# Patient Record
Sex: Female | Born: 1990 | Race: White | Hispanic: No | Marital: Single | Smoking: Current every day smoker
Health system: Southern US, Community
[De-identification: ages and names within clinical notes are randomized; demographics above are authoritative.]

## PROBLEM LIST (undated history)

## (undated) DIAGNOSIS — F32A Depression, unspecified: Secondary | ICD-10-CM

## (undated) DIAGNOSIS — F329 Major depressive disorder, single episode, unspecified: Secondary | ICD-10-CM

## (undated) DIAGNOSIS — N200 Calculus of kidney: Secondary | ICD-10-CM

## (undated) DIAGNOSIS — F419 Anxiety disorder, unspecified: Secondary | ICD-10-CM

## (undated) DIAGNOSIS — K219 Gastro-esophageal reflux disease without esophagitis: Secondary | ICD-10-CM

## (undated) DIAGNOSIS — Z3490 Encounter for supervision of normal pregnancy, unspecified, unspecified trimester: Secondary | ICD-10-CM

## (undated) HISTORY — PX: CHOLECYSTECTOMY: SHX55

## (undated) HISTORY — DX: Anxiety disorder, unspecified: F41.9

## (undated) HISTORY — PX: OTHER SURGICAL HISTORY: SHX169

## (undated) HISTORY — DX: Major depressive disorder, single episode, unspecified: F32.9

## (undated) HISTORY — PX: EXTERNAL EAR SURGERY: SHX627

## (undated) HISTORY — PX: TONSILLECTOMY: SUR1361

## (undated) HISTORY — DX: Depression, unspecified: F32.A

---
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
## 2010-12-06 LAB — CBC
HCT: 32.8 % — ABNORMAL LOW (ref 36.0–46.0)
MCV: 91.7 fL (ref 78.0–100.0)
Platelets: 287 10*3/uL (ref 150–400)
RDW: 13.1 % (ref 11.5–15.5)

## 2010-12-11 LAB — CBC
Platelets: 210 10*3/uL (ref 150–400)
RDW: 12.5 % (ref 11.4–15.5)

## 2010-12-11 LAB — BASIC METABOLIC PANEL
BUN: 4 mg/dL — ABNORMAL LOW (ref 6–23)
Calcium: 9 mg/dL (ref 8.4–10.5)
Glucose, Bld: 82 mg/dL (ref 70–99)
Sodium: 137 mEq/L (ref 135–145)

## 2010-12-11 LAB — HEPATIC FUNCTION PANEL
AST: 13 U/L (ref 0–37)
Albumin: 3.5 g/dL (ref 3.5–5.2)

## 2010-12-13 LAB — CBC
Hemoglobin: 12.6 g/dL (ref 12.0–16.0)
RBC: 3.92 MIL/uL (ref 3.80–5.70)
RDW: 14.8 % (ref 11.4–15.5)

## 2010-12-13 LAB — URINALYSIS, ROUTINE W REFLEX MICROSCOPIC
Ketones, ur: NEGATIVE mg/dL
Nitrite: NEGATIVE
Specific Gravity, Urine: 1.015 (ref 1.005–1.030)
pH: 6.5 (ref 5.0–8.0)

## 2010-12-13 LAB — COMPREHENSIVE METABOLIC PANEL
ALT: 12 U/L (ref 0–35)
AST: 14 U/L (ref 0–37)
Albumin: 4.2 g/dL (ref 3.5–5.2)
Alkaline Phosphatase: 65 U/L (ref 47–119)
BUN: 8 mg/dL (ref 6–23)
CO2: 28 mEq/L (ref 19–32)
Calcium: 9.4 mg/dL (ref 8.4–10.5)
Chloride: 105 mEq/L (ref 96–112)
Creatinine, Ser: 0.57 mg/dL (ref 0.4–1.2)
Glucose, Bld: 85 mg/dL (ref 70–99)
Potassium: 3.5 mEq/L (ref 3.5–5.1)
Sodium: 138 mEq/L (ref 135–145)
Total Bilirubin: 0.5 mg/dL (ref 0.3–1.2)
Total Protein: 7.6 g/dL (ref 6.0–8.3)

## 2010-12-13 LAB — DIFFERENTIAL
Basophils Relative: 1 % (ref 0–1)
Eosinophils Absolute: 0.3 10*3/uL (ref 0.0–1.2)
Eosinophils Relative: 3 % (ref 0–5)
Monocytes Absolute: 0.7 10*3/uL (ref 0.2–1.2)
Monocytes Relative: 7 % (ref 3–11)
Neutrophils Relative %: 68 % (ref 43–71)

## 2010-12-13 LAB — PREGNANCY, URINE: Preg Test, Ur: POSITIVE

## 2011-01-11 ENCOUNTER — Emergency Department (HOSPITAL_COMMUNITY): Payer: Medicaid Other

## 2011-01-11 ENCOUNTER — Emergency Department (HOSPITAL_COMMUNITY)
Admission: EM | Admit: 2011-01-11 | Discharge: 2011-01-11 | Disposition: A | Discharge status: Home / Self Care | Payer: Medicaid Other | Attending: Emergency Medicine | Admitting: Emergency Medicine

## 2011-01-11 DIAGNOSIS — M25539 Pain in unspecified wrist: Secondary | ICD-10-CM | POA: Insufficient documentation

## 2011-01-11 DIAGNOSIS — M65839 Other synovitis and tenosynovitis, unspecified forearm: Secondary | ICD-10-CM | POA: Insufficient documentation

## 2011-01-15 NOTE — Consult Note (Signed)
NAMEEZRI, FANGUY                 ACCOUNT NO.:  [id number]   MEDICAL RECORD NO.:  [id number]          PATIENT TYPE:  INP   LOCATION:  6123                         FACILITY:  [facility]   PHYSICIAN:  Zola Button T. Lazarus Salines, M.D. DATE OF BIRTH:  1991-03-12   DATE OF CONSULTATION:  12/06/2007  DATE OF DISCHARGE:                                 CONSULTATION   CHIEF COMPLAINT:  Bleeding.   HISTORY:  A 20 year old white female underwent a tonsillectomy  approximately 9 days ago by Dr. Gerilyn Pilgrim.  Four hours ago, she had sudden  onset bright red bleeding from her throat while sitting watching TV.  She has continued to bleed.  She went to the [facility]  emergency room where she continued to bleed.  An IV was started and she  was transferred down to [facility] for further attention.  She has  been vomiting some blood.  She is not a known free-bleeder.  She had her  adenoids out some years ago.  She otherwise has been really healing  quite nicely from the surgery with decent pain control, no prior  bleeding, and good advancement of diet.   PAST MEDICAL HISTORY:  No known allergies.  She has had multiple prior  surgeries on her ears.  No current active medical conditions.   SOCIAL HISTORY:  She is in high school.   FAMILY HISTORY:  Noncontributory.   REVIEW OF SYSTEMS:  Noncontributory.   EXAMINATION:  This is a chubby, distressed, adolescent white female who  is actively bleeding from the mouth.  In the 15 minutes I spent with her  she bled approximately 300 mL.  Mental status is sharp.  She seems to  hear well in conversational speech.  Voice is clear and respirations  unlabored through the nose.  Head is atraumatic and neck supple.  Cranial nerves grossly intact.  I did not perform any further  examination.  LUNGS:  Clear to auscultation.  HEART:  Revealed a slightly rapid rate but regular and without murmurs.  ABDOMEN:  Obese but active.   IMPRESSION:  Post tonsillectomy  hemorrhage.   PLAN:  To the OR for control.  I have discussed this with Dr. Gypsy Balsam,  anesthesiologist, and with the parents.  Informed consent was obtained.     Gloris Manchester. Lazarus Salines, M.D.  Electronically Signed    KTW/MEDQ  D:  12/06/2007  T:  12/06/2007  Job:  [id number]   cc:   Lucky Cowboy, MD

## 2011-01-15 NOTE — Op Note (Signed)
NAME:  Rachel Meadows, Rachel Meadows                 ACCOUNT NO.:  [id number]   MEDICAL RECORD NO.:  [id number]          PATIENT TYPE:  AMB   LOCATION:  DAY                           FACILITY:  [facility]   PHYSICIAN:  Dalia Heading, M.D.  DATE OF BIRTH:  29-Apr-1991   DATE OF PROCEDURE:  01/16/2009  DATE OF DISCHARGE:  01/16/2009                               OPERATIVE REPORT   PREOPERATIVE DIAGNOSES:  Cholecystitis, cholelithiasis, intrauterine  pregnancy.   POSTOPERATIVE DIAGNOSIS:  Cholecystitis, cholelithiasis, intrauterine  pregnancy.   PROCEDURE:  Laparoscopic cholecystectomy.   SURGEON:  Dalia Heading, MD   ANESTHESIA:  General endotracheal.   INDICATIONS:  The patient is a 20 year old white female who is in her  second trimester pregnancy, who now presents for laparoscopic  cholecystectomy due to biliary colic.  Risks and benefits of the  procedure including bleeding, infection, hepatobiliary injury, the  possibility of an open procedure, and the possibly of a miscarriage were  fully explained to the patient and mother, who gave informed consent for  the patient as the patient is a minor.   PROCEDURE NOTE:  The patient was placed in the supine position.  After  induction of general endotracheal anesthesia, the abdomen was prepped  and draped using the usual sterile technique with Betadine.  Surgical  site confirmation was performed.  A supraumbilical incision was made  down to the fascia.  A Veress needle was introduced into the abdominal  cavity and confirmation of placement was done using the saline drop  test.  The abdomen was then insufflated to 16 mmHg pressure.  An 11-mm  trocar was introduced into the abdominal cavity under direct  visualization without difficulty.  The patient was placed in reverse  Trendelenburg position, and additional 11-mm trocar was placed in the  epigastric region, and 5-mm trocars were placed in the right upper  quadrant and right flank regions.  The  uterus was inspected and noted to  be gravid in nature.  No abnormalities were noted.  The liver was  inspected and noted to be within normal limits.  The gallbladder was  retracted superior and laterally.  The dissection was begun around the  infundibulum of the gallbladder.  The cystic duct was first identified.  Its juncture to the infundibulum fully identified.  EndoClips were  placed proximally and distally on the cystic duct, and the cystic duct  was divided.  This likewise done in the cystic artery.  The gallbladder  was then freed away from the gallbladder fossa using Bovie  electrocautery.  The gallbladder was delivered through the epigastric  trocar site using EndoCatch bag.  The gallbladder fossa was inspected.  No abnormal bleeding or bile leakage was noted.  Surgicel was placed in  the gallbladder fossa.  All fluid and air were then evacuated from the  abdominal cavity prior to removal of the trocars.   All wounds were irrigated with normal saline.  All wounds were checked  with 0.5% Sensorcaine.  The supraumbilical fascia was reapproximated  using an 0 Vicryl interrupted suture.  All skin incisions were closed  using staples.  Betadine ointment and dry sterile dressings were  applied.   All tape and needle counts were correct at the end of the procedure.  The patient was extubated in the operating room, went back to recovery  room, awake in stable condition.   COMPLICATIONS:  None.   SPECIMEN:  Gallbladder.   BLOOD LOSS:  Minimal.   Of note was the fact that fetal heart tones were noted both pre and  postoperatively.      Dalia Heading, M.D.  Electronically Signed     MAJ/MEDQ  D:  01/16/2009  T:  01/17/2009  Job:  [id number]   cc:   Lazaro Arms, M.D.  Fax: [phone]

## 2011-01-15 NOTE — Op Note (Signed)
NAMESTEPHENIA, VOGAN NO.:  [id number]   MEDICAL RECORD NO.:  [id number]          PATIENT TYPE:  AMB   LOCATION:  DSC                          FACILITY:  [facility]   PHYSICIAN:  Lucky Cowboy, MD         DATE OF BIRTH:  05/24/1991   DATE OF PROCEDURE:  11/27/2007  DATE OF DISCHARGE:                               OPERATIVE REPORT   PREOPERATIVE DIAGNOSIS:  Chronic tonsillitis   POSTOPERATIVE DIAGNOSIS:   PROCEDURE:  Tonsillectomy.   SURGEON:  Lucky Cowboy, MD   ANESTHESIA:  General endotracheal anesthesia.   ESTIMATED BLOOD LOSS:  Less than 20 mL.   SPECIMENS:  Bilateral tonsils.   COMPLICATIONS:  None.   INDICATIONS:  This patient is a 20 year old female who has had problems  with chronic tonsillitis.  This is causing her significant discomfort.  She has been treated with multiple courses of antibiotic therapy.  For  these reasons, in addition to a mildly asymmetric and enlarged left  tonsil, tonsillectomy is performed.  The patient has already undergone  adenoidectomy in the past.   FINDINGS:  The patient was noted to have a left tonsil which was  enlarged to approximately 4+ and the right tonsil was 3+.  Chronic  infection and scarring were noted.  There had been no regrowth of the  adenoid tissue.   PROCEDURE:  The patient was taken to the operating room and placed on  the table in the supine position.  She was then placed under general  endotracheal anesthesia and the table was rotated counterclockwise 90  degrees.  The neck was gently extended.  A Crowe-Davis mouth gag with a  #3 tongue blade was then placed intraorally, opened and suspended on the  Mayo stand.  Palpation of the soft palate was without evidence of  abnormality or submucosal cleft and the nasopharynx was without regrowth  of adenoid tissue.  The right palatine tonsil was grasped with Allis  clamps and directed inferomedially.  The cautery was used to excise the  tonsil, staying within  the peritonsillar space adjacent to the tonsillar  capsule.  The left palatine tonsil was removed in an identical fashion.  The nasopharynx was copiously irrigated transnasally with normal saline,  which was suctioned out through the oral cavity.  An NG tube  was placed down the esophagus for suctioning of the gastric contents.  The mouth gag was removed, noting no damage to the teeth or soft  tissues.  The patient was awakened from anesthesia and taken to the Post  Anesthesia Care Unit in stable condition.  There were no complications.      Lucky Cowboy, MD  Electronically Signed     SJ/MEDQ  D:  11/27/2007  T:  11/28/2007  Job:  [id number]   cc:   [facility]

## 2011-01-15 NOTE — Op Note (Signed)
[name], Rachel Meadows                 ACCOUNT NO.:  [id number]   MEDICAL RECORD NO.:  [id number]          PATIENT TYPE:  INP   LOCATION:  6123                         FACILITY:  [facility]   PHYSICIAN:  Zola Button T. Lazarus Salines, M.D. DATE OF BIRTH:  12-Dec-1990   DATE OF PROCEDURE:  12/06/2007  DATE OF DISCHARGE:                               OPERATIVE REPORT   PREOPERATIVE DIAGNOSIS:  Post tonsillectomy hemorrhage.   POSTOPERATIVE DIAGNOSIS:  Post tonsillectomy hemorrhage.   PROCEDURE PERFORMED:  Control of post tonsillectomy hemorrhage.   SURGEON:  Gloris Manchester. Lazarus Salines, M.D.   ANESTHESIA:  General orotracheal.   BLOOD LOSS:  25 mL   COMPLICATIONS:  None.   FINDINGS:  Properly healing tonsil fossae with an arteriolar bleeding  vessel in the mid left tonsil fossa.   PROCEDURE:  With the patient in a comfortable supine position, general  orotracheal anesthesia was induced without difficulty.  At an  appropriate level, the table was turned 90 degrees and the patient  placed in Trendelenburg.  Taking care to protect lips, teeth, and  endotracheal tube, the Crowe-Davis mouth gag was introduced, expanded  for visualization, and suspended from the Mayo stand in the standard  fashion.  The findings were as described above.  A large clot was  removed from the left tonsil fossa and active bleeding was noted in the  mid pole; this was controlled with suction cautery.  Hemostasis was  achieved.  An 18-French orogastric tube was placed into the stomach and  approximately 50 mL of old brown/black blood and some food particles  were evacuated from the stomach.  The tube was removed.  Hemostasis was  again observed.  At this point, the mouth gag was relaxed for several  minutes.  Upon re-expansion, hemostasis was persistent.  At this point,  the procedure was complete and mouth gag was relaxed and removed.  The  dental status was intact.  The patient was returned to Anesthesia,  awakened, extubated, and  transferred to Recovery in stable condition.   COMMENT:  [age]-year-old white female, now 9 days status post  tonsillectomy, had the onset of fairly severe bright red bleeding from  her throat 4 hours ago.  She was observed in the [facility] emergency room and  continued to bleed and was sent down to [facility] for further  attention.  I anticipate a routine postoperative recovery with  attention to analgesia and antibiosis.  Hemoglobin preoperatively was  13.8 and we will check this again in a few hours to see how much she has  drop.  We will check for orthostatic blood pressure changes and probably  discharge to home after a few hours of IV hydration and observation.      Gloris Manchester. Lazarus Salines, M.D.  Electronically Signed     KTW/MEDQ  D:  12/06/2007  T:  12/06/2007  Job:  [id number]   cc:   Lucky Cowboy, MD

## 2011-01-15 NOTE — H&P (Signed)
NAME:  Rachel Meadows, Rachel Meadows NO.:  [id number]   MEDICAL RECORD NO.:  [id number]          PATIENT TYPE:  AMB   LOCATION:  DAY                           FACILITY:  [facility]   PHYSICIAN:  Dalia Heading, M.D.  DATE OF BIRTH:  [birth year]   DATE OF ADMISSION:  DATE OF DISCHARGE:  LH                              HISTORY & PHYSICAL   CHIEF COMPLAINT:  Cholecystitis, cholelithiasis, and intrauterine  pregnancy.   HISTORY OF PRESENT ILLNESS:  The patient is a 20 year old white female  who is referred for evaluation and treatment of biliary colic secondary  to cholelithiasis.  She has been having intermittent right upper  quadrant abdominal pain with radiation to the flank, nausea, and  indigestion for the past few months.  No fever, jaundice, or chills have  been noted.  She was recently found to be pregnant with her estimated  date of delivery at July 15, 2009.   PAST MEDICAL HISTORY:  Unremarkable.   PAST SURGICAL HISTORY:  Oral surgery, tonsillectomy.   CURRENT MEDICATIONS:  None.   ALLERGIES:  PERCOCET.   REVIEW OF SYSTEMS:  The patient does smoke tobacco occasionally.  She  denies alcohol use.   PHYSICAL EXAMINATION:  GENERAL:  The patient is well-developed and well-  nourished white female in no acute distress.  HEENT:  No scleral icterus.  LUNGS:  Clear to auscultation with equal breath sounds bilaterally.  HEART:  Regular rate and rhythm without S3, S4, or murmurs.  ABDOMEN:  Soft, nontender, and nondistended.  No hepatosplenomegaly or  hernias are noted.   Ultrasound gallbladder reveals cholelithiasis with a normal common bile  duct.   IMPRESSION:  1. Cholecystitis, cholelithiasis.  2. Intrauterine pregnancy.   PLAN:  The patient is now in the second trimester of her pregnancy at  approximately 14 weeks'.  She is being followed by Dr. Emelda Fear.  We are  going to go ahead and proceed with a laparoscopic cholecystectomy on  Jan 16, 2009.  The risks and  benefits of the procedure including  bleeding, infection, hepatobiliary, that possibly of an open procedure,  miscarriage, or pedal distress, were fully explained to the patient and  mother, who gives informed consent for the patient, as the patient is a  minor.      Dalia Heading, M.D.  Electronically Signed     MAJ/MEDQ  D:  01/10/2009  T:  01/10/2009  Job:  [id number]   cc:   Short-stay at [facility], M.D.  Fax: [phone]   Tilda Burrow, M.D.  Fax: [phone]

## 2011-01-18 NOTE — H&P (Signed)
NAME:  SULA, FETTERLY NO.:  [id number]   MEDICAL RECORD NO.:  [id number]          PATIENT TYPE:  INP   LOCATION:  0100                          FACILITY:  [facility]   PHYSICIAN:  Lalla Brothers, MDDATE OF BIRTH:  05-Oct-1990   DATE OF ADMISSION:  12/07/2006  DATE OF DISCHARGE:                       PSYCHIATRIC ADMISSION ASSESSMENT   IDENTIFICATION:  A 50-1/20-year-old female, currently ninth grade student  at home schooling having dropped out of [employer] is admitted  emergently involuntarily on a [facility] petition for commitment  initiated by parents in transfer from [facility] Emergency  Department for inpatient stabilization and treatment of suicide risk and  depression.  The patient was brought by law enforcement to the emergency  department where she then denied her symptoms, similar to the arguments  with mother at home that again contribute to her plan to jump to death  from a second story window.  The patient also threatened to commit  suicide if her boyfriend's father gets in trouble with the police,  apparently referring to the boyfriend's involvement of herself and while  breaking activity with sex and drugs.  Family fears family injury by the  patient as well.   HISTORY OF PRESENT ILLNESS:  The patient has had multiple losses in life  and continues to live in a pattern that recreates and reenacts these.  They do not give information about biological father except to state  that the paternal uncle completed suicide.  The patient currently  resides with mother, stepfather, in an older sister, age 33.  The  patient had significant health consequences including a fracture to the  acetabulum of the right hip joint in a sledding accident in 2002.  She  also has significant hearing loss despite 8 surgeries predominately at  [facility], but does not use hearing aid.  She is exhibiting running away,  property destruction, and defiance  which again threaten her current  relationship with boyfriend as parents no longer allow her to see the  boyfriend.  They report she has been sneaking out of the house after  curfew with the boyfriend, involved in sexual activity and drugs.  Her  urine drug screen was positive for cannabis and she also reports using  alcohol at times.  She smokes cigarettes regularly.  She is depressed in  the emergency department and parents note that symptoms have been  present for 8-12 months including disruptive behavior and depression.  The patient has diminished appetite and increased sleep.  She has  impulse control difficulties and self-mutilation scars on the right  forearm.  The patient is noted to be angry and flat emotionally in the  emergency department, and her only involvement is manipulating for  release.  She has had no previous psychiatric care that can be  determined.  She is not more clear about her withdrawal from school.  She refuses to cooperate in the admitting process, be hospitalized  involuntarily and refusing to sign papers or to participate effectively.  She does not acknowledge definite manic symptoms although she was  referred as having unspecified  mood disorder as though manic symptoms  might be considered.  She does not acknowledge hallucinations, paranoia,  or delusions.  She does not acknowledge definite psychic or sexual  trauma to suggest any post-traumatic flashbacks in her reenactment  patterns.   PAST MEDICAL HISTORY:  The patient denies that she would have a primary  care physician.  She has had multiple visits to the [facility] since 2002.  Apparently, most of her mastoiditis surgery was  performed at [facility] and they report that she has had 8 surgeries.  She did  have a PICC line placed apparently in 2002 or just before as part of  these surgeries and postop care.  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  She had a CT scan of the  head in 2002 apparently as part of her ongoing care for mastoiditis  which was otherwise negative.  She has had an adenoidectomy in the past.  The patient had a sledding accident in 2002 with injury assessed by MRI  and CT scan to conclude a fracture of the acetabulum/ischial portion of  the right hip joint.  She is somewhat overweight and has striae at the  waist.  He has abrasions and scars of the right forearm.  Her knuckles  are red bilaterally on the hands though she does not clarify contusions  from striking out, excessive handwashing, or purging.  She suggests that  her menses occur approximately every 3 months.  She had previously  reported a menses 06/21/06.  At the time of this admission, she suggests  that she at least had 1 menses in the interim though the patient states  she could be pregnant from sexual activity with boyfriend.  She reports  sensitivity or allergy to Percocet becoming over-activated or that any  delirium occurred.  Her potassium was 3.4 in the emergency department  prior to transfer raising questions of possible eating disorder.  She is  on no medications currently.  She denies seizure or syncope.  She denies  heart murmur or arrhythmia.   REVIEW OF SYSTEMS:  The patient denies difficulty with gait, gaze, or  continence.  She has no known exposure to communicable disease or toxins  other than apparent sexual activity.  She has no rash, jaundice, or  purpura otherwise.  There is no headache or sensory loss currently  except for her hearing loss which is chronic.  She denies memory loss or  coordination deficit.  She has no cough, congestion, chest pain,  palpitations, or presyncope.  There is no abdominal pain, nausea,  vomiting, or diarrhea.  There is no dysuria or arthralgia.  She does not answer questions about purging or sexual activity other than states that  she  might be pregnant.   Immunizations are up-to-date.   FAMILY HISTORY:  The patient resides with mother, stepfather, and an 68-  year-old sister.  Paternal uncle completed suicide.  They do not clarify  information otherwise about biological father or other family members.  Mother has apparently taken out the involuntary petition and called law  enforcement to the home prior to arrival to the emergency department.   SOCIAL AND DEVELOPMENTAL HISTORY:  The patient is a ninth grade student  currently and home schooling though the patient refuses to follow any  schedule.  She generally sleeps late and does not participate in  any  responsibilities including to complete school work.  She does state that  she was in [employer] in the past in the ninth grade, but is  no longer in public school.  Mother suggests that the patient has a  court date coming up at 0930.  The patient wants to be a International aid/development worker,  but is participating in no way in school that would suggest that hope to  be realistic.  She has apparently been sexually active with boyfriend  and law breaking and running away at night and using cannabis and  alcohol.  The patient seems to expect that she will put family in  danger, boyfriend and his father with law, and threaten suicide if any  legal action is taken.  She uses cannabis and urine drug screen is  positive in the emergency department.  She also has a history of using  alcohol, but will not give details of initial, pattern, or consequences  of use.   ASSETS:  The patient is intellectually capable of benefiting from  treatment.   MENTAL STATUS EXAMINATION:  Height is 61 inches and weight is 73.4 kg.  Blood pressure is 129/78 with heart rate of 69 sitting, and 123/79 with  heart rate of 80 standing.  She is right-handed.  The patient is  withdrawn and resistant, refusing to cooperate fully with exam or  initial programming.  Cranial nerves 2 through 12 are intact.   Muscle  strength and tone appear normal.  There are no abnormal involuntary  movements.  There are no pathologic reflexes or soft neurologic  findings.  Gait and gaze appear intact.  The patient is right-handed.  She manifests leaden fatigue and hypersensitivity to the comments or  actions of others.  She appears to have rejection sensitivity.  She has  impulse control difficulty and significant denial and distortion.  She  has rejection sensitivity, as well as hypersensitivity to the comments  and reactions of others.  She has no obvious anxiety.  She has  cumulative pattern of object relations loss and consequences of acting  out.  She has diminished energy.  She appears motivated by sensation  seeking, but has few other responsible or age-appropriate interests.  She has no definite psychosis or mania evident at the time of admission.  She denies flashbacks or re-experiencing otherwise, though reenactment patterns are suggested in her pattern of loss.  She has suicidal  ideation and plan to jump from a height.   IMPRESSION:  AXIS I:  1. Depressive disorder not otherwise specified with atypical features.  2. Oppositional defiant disorder.  3. Cannabis abuse.  4. Rule out eating disorder not otherwise specified with possible      purging (provisional diagnosis).  5. Other interpersonal problem.  6. Parent-child problem.  7. Other specified family circumstances  8. Noncompliance with initial treatment.   AXIS II:  Diagnosis deferred.   AXIS III:  1. Abrasions and contusions.  2. 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  3. Amenorrhea by history.  4. Overweight.  5. Status post acetabular fracture of the right hip 2002.  6. Sensitive to Percocet manifested by behavioral agitation.  7. Cigarette smoking.   AXIS IV:  Stressors family, moderate acute and chronic; school, severe  acute and chronic; phase of life severe, acute and chronic.   AXIS V:  GAF on admission is  37 with highest in the last year 67.   PLAN:  The patient is admitted for inpatient adolescent psychiatric  and  multidisciplinary multimodal behavioral treatment in a team-based  problematic locked psychiatric unit.  We will consider Wellbutrin or  Cymbalta pharmacotherapy.  Cognitive behavioral therapy, interpersonal  therapy, anger management, substance abuse prevention and intervention,  social and communication skills, problem-solving and coping skills,  family therapy, and identity consolidation can be undertaken.  Estimated  length of stay is 5-7 days with target symptoms for discharge being  stabilization of suicide risk and mood, stabilization of dangerous  disruptive behavior and generalization of the capacity for safe,  effective participation in outpatient treatment that can be  psychosocially coordinated with upcoming court proceedings if needed.      Lalla Brothers, MD  Electronically Signed     GEJ/MEDQ  D:  12/07/2006  T:  12/08/2006  Job:  [id number]

## 2011-01-18 NOTE — H&P (Signed)
[facility]  Patient:    Rachel Meadows Visit Number: [id number] MRN: [id number]          Service Type: EMS Location: ED Attending Physician:  Devoria Albe Dictated by:   Almedia Balls. Ranell Patrick, M.D. Admit Date:  08/06/2001 Discharge Date: 08/06/2001                           History and Physical  HISTORY OF PRESENT ILLNESS:  Rachel Meadows is a 20 year old female with a history of chronic mastitis requiring PICC line placement and IV antibiotics, who sustained a fall while sledding on August 06, 2001.  The patient complained of immediate severe right hip pain.  The patient was unable to weightbear after this injury and was brought to the [facility] Emergency Room for evaluation of intractable right hip pain.  The patient denies any past history of right hip pain.  The patient has been on narcotic pain medications for this chronic mastitis and is followed at [facility].  She is currently in the middle of an IV antibiotic course and has a PICC line placed.  PAST MEDICAL HISTORY:  As mentioned, chronic mastitis.  PAST SURGICAL HISTORY: 1. Seven surgeries for the mastitis, last surgery was January 2002. 2. Surgery for adenoidectomy.  FAMILY HISTORY:  Noncontributory.  ALLERGIES:  None.  PHYSICAL EXAMINATION:  GENERAL:  Well-developed, well-nourished, healthy-appearing female in moderate to severe distress.  Alert and oriented.  HEENT:  Normal.  CHEST:  Clear bilaterally.  HEART:  Regular.  ABDOMEN:  Soft and nontender.  EXTREMITIES:  Right hip range of motion is severely restricted secondary to pain.  Any internal or external rotation causes severe pain.  Neurovascularly intact.  With 2+ dorsalis pedis pulse.  Left hip is nonirritable with pain-free range of motion.  LABORATORY DATA:  AP and lateral of the femur are negative for acute fracture. AP and frog-leg of the right hip also show no evidence of fracture or physeal injury.   The acetabulum appears normal as well as the pelvis.  MRI scan was done at [facility] Emergency Room and reveals a nondisplaced ischial fracture involving the ischial portion of the acetabulum.  There is no obvious extension in the joint, although there is a large joint effusion present on the right side which is not noted on the left.  IMPRESSION:  Right acetabular fracture.  PLAN:  Admission for pain control.  Physical therapy for nonweightbearing, gait training on the right side. Dictated by:   Almedia Balls Ranell Patrick, M.D. Attending Physician:  Devoria Albe DD:  08/07/01 TD:  08/08/01 Job: [id number]
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
## 2011-02-07 ENCOUNTER — Other Ambulatory Visit (HOSPITAL_COMMUNITY): Payer: Self-pay | Admitting: Family Medicine

## 2011-02-07 DIAGNOSIS — F4320 Adjustment disorder, unspecified: Secondary | ICD-10-CM

## 2011-02-07 DIAGNOSIS — R109 Unspecified abdominal pain: Secondary | ICD-10-CM

## 2011-02-11 ENCOUNTER — Ambulatory Visit (HOSPITAL_COMMUNITY)
Admission: RE | Admit: 2011-02-11 | Discharge: 2011-02-11 | Disposition: A | Discharge status: Home / Self Care | Payer: Medicaid Other | Source: Ambulatory Visit | Attending: Family Medicine | Admitting: Family Medicine

## 2011-02-11 DIAGNOSIS — F4320 Adjustment disorder, unspecified: Secondary | ICD-10-CM

## 2011-02-11 DIAGNOSIS — R109 Unspecified abdominal pain: Secondary | ICD-10-CM | POA: Insufficient documentation

## 2011-03-18 ENCOUNTER — Encounter (INDEPENDENT_AMBULATORY_CARE_PROVIDER_SITE_OTHER): Payer: Self-pay

## 2011-04-03 ENCOUNTER — Ambulatory Visit (INDEPENDENT_AMBULATORY_CARE_PROVIDER_SITE_OTHER): Payer: Medicaid Other | Admitting: Internal Medicine

## 2011-04-03 NOTE — Progress Notes (Unsigned)
Subjective:     Patient ID: Rachel Meadows, female   DOB: 24-Mar-1991, 20 y.o.   MRN: [id number]  HPI Rachel Meadows is a 20 yr old female referred to our office by Dr Regino Schultze for abdominal pain.  US abdomen 02/11/2011 for abdominal pain. CBC 4.51mm. Gallbladder surgically absent. Negative abdominal US. 03/01/2011 K 4.1, Chloride 106, C02 23, Glucose 81, BUN 7, Creatinine 0.55, Total bili 0.4, ALP 67, AST 11, ALT 14, Protein 7.6, Albumin 4.5, Calcium 9.6, Review of Systems     Objective:   Physical Exam     Assessment:     ***    Plan:     ***

## 2011-04-17 ENCOUNTER — Emergency Department (HOSPITAL_COMMUNITY)
Admission: EM | Admit: 2011-04-17 | Discharge: 2011-04-17 | Disposition: A | Discharge status: Home / Self Care | Payer: Medicaid Other | Attending: Emergency Medicine | Admitting: Emergency Medicine

## 2011-04-17 ENCOUNTER — Encounter (HOSPITAL_COMMUNITY): Payer: Self-pay | Admitting: *Deleted

## 2011-04-17 DIAGNOSIS — R1110 Vomiting, unspecified: Secondary | ICD-10-CM | POA: Insufficient documentation

## 2011-04-17 DIAGNOSIS — R1011 Right upper quadrant pain: Secondary | ICD-10-CM | POA: Insufficient documentation

## 2011-04-17 DIAGNOSIS — R1084 Generalized abdominal pain: Secondary | ICD-10-CM

## 2011-04-17 DIAGNOSIS — Z9889 Other specified postprocedural states: Secondary | ICD-10-CM | POA: Insufficient documentation

## 2011-04-17 LAB — URINALYSIS, ROUTINE W REFLEX MICROSCOPIC
Bilirubin Urine: NEGATIVE
Ketones, ur: NEGATIVE mg/dL
Leukocytes, UA: NEGATIVE
Nitrite: NEGATIVE
Protein, ur: NEGATIVE mg/dL
pH: 7.5 (ref 5.0–8.0)

## 2011-04-17 LAB — BASIC METABOLIC PANEL
BUN: 6 mg/dL (ref 6–23)
Calcium: 10 mg/dL (ref 8.4–10.5)
Creatinine, Ser: 0.47 mg/dL — ABNORMAL LOW (ref 0.50–1.10)
GFR calc Af Amer: >60 mL/min (ref >60)
GFR calc non Af Amer: >60 mL/min (ref >60)

## 2011-04-17 LAB — HEPATIC FUNCTION PANEL
AST: 10 U/L (ref 0–37)
Albumin: 4.1 g/dL (ref 3.5–5.2)
Alkaline Phosphatase: 86 U/L (ref 39–117)
Total Protein: 7.9 g/dL (ref 6.0–8.3)

## 2011-04-17 LAB — CBC
MCH: 29.5 pg (ref 26.0–34.0)
MCHC: 33.3 g/dL (ref 30.0–36.0)
MCV: 88.5 fL (ref 78.0–100.0)
Platelets: 314 10*3/uL (ref 150–400)
RDW: 12.8 % (ref 11.5–15.5)
WBC: 10.5 10*3/uL (ref 4.0–10.5)

## 2011-04-17 LAB — WET PREP, GENITAL
Trich, Wet Prep: NONE SEEN
Yeast Wet Prep HPF POC: NONE SEEN

## 2011-04-17 LAB — LIPASE, BLOOD: Lipase: 23 U/L (ref 11–59)

## 2011-04-17 LAB — URINE MICROSCOPIC-ADD ON

## 2011-04-17 MED ORDER — MORPHINE SULFATE 4 MG/ML IJ SOLN
4.0000 mg | Freq: Once | INTRAMUSCULAR | Status: AC
  Administered 2011-04-17: 4 mg via INTRAVENOUS
  Filled 2011-04-17: qty 1

## 2011-04-17 MED ORDER — ONDANSETRON HCL 4 MG/2ML IJ SOLN
4.0000 mg | Freq: Once | INTRAMUSCULAR | Status: AC
  Administered 2011-04-17: 4 mg via INTRAVENOUS
  Filled 2011-04-17: qty 2

## 2011-04-17 MED ORDER — OXYCODONE-ACETAMINOPHEN 5-325 MG PO TABS: 1.0000 | ORAL_TABLET | Freq: Four times a day (QID) | ORAL | Status: AC | PRN

## 2011-04-17 NOTE — ED Provider Notes (Signed)
Scribed for Dr. Bernette Mayers, the patient was seen in room H2. This chart was scribed by Hillery Hunter. This patient's care was started at 15:40.   History     CSN: [id number] Arrival date & time: 04/17/2011  1:28 PM  Chief Complaint  Patient presents with  . Abdominal Pain   Patient is a 20 y.o. female presenting with abdominal pain. The history is provided by the patient.  Abdominal Pain The primary symptoms of the illness include abdominal pain and vomiting. The primary symptoms of the illness do not include fever, shortness of breath or diarrhea.  Symptoms associated with the illness do not include constipation.   Rachel Meadows is a 20 y.o. female who presents to the Emergency Department complaining of "sore" right upper quadrant abdominal pain. She reports having some pain every day for the last five months after giving birth to her second child, but that this pain has been new and more intense over the last three days. She has had some intermittent vomiting including one episode today but is still keeping down some PO intake. She denies constipation, diarrhea, rectal bleeding.   Past Medical History  Diagnosis Date  . Depression     panic attacks  . Anxiety   . Hearing loss     Past Surgical History  Procedure Date  . Cholecystectomy   . Anoidectomy     No family history on file.  History  Substance Use Topics  . Smoking status: Current Everyday Smoker    Types: Cigarettes  . Smokeless tobacco: Not on file  . Alcohol Use: No    Review of Systems  Constitutional: Negative for fever.  Respiratory: Negative for shortness of breath.   Cardiovascular: Negative for chest pain.  Gastrointestinal: Positive for vomiting and abdominal pain. Negative for diarrhea, constipation and blood in stool.  All other systems reviewed and are negative.    Physical Exam  BP 136/99  Pulse 97  Temp(Src) 97.6 F (36.4 C) (Oral)  Resp 17  Ht 5' (1.524 m)  Wt 100 lb (45.36  kg)  BMI 19.53 kg/m2  SpO2 100%  Physical Exam  Nursing note and vitals reviewed. Constitutional: She is oriented to person, place, and time. She appears well-developed and well-nourished. No distress.  HENT:  Head: Normocephalic and atraumatic.  Eyes: Conjunctivae are normal. No scleral icterus.  Neck: Neck supple.  Cardiovascular: Normal rate, regular rhythm and normal heart sounds.   No murmur heard. Pulmonary/Chest: Effort normal and breath sounds normal. No respiratory distress. She has no wheezes.  Abdominal: Soft. Bowel sounds are normal. She exhibits no distension. There is tenderness (RUQ). There is no rebound.  Neurological: She is alert and oriented to person, place, and time.  Skin: Skin is warm and dry.  Psychiatric: She has a normal mood and affect. Her behavior is normal.    ED Course  Procedures  OTHER DATA REVIEWED: Nursing notes, vital signs, and past medical records reviewed.   DIAGNOSTIC STUDIES: Oxygen Saturation is 100% on room air, normal by my interpretation.     LABS / RADIOLOGY:  Results for orders placed during the hospital encounter of 04/17/11  CBC      Component Value Range   WBC 10.5  4.0 - 10.5 (K/uL)   RBC 4.61  3.87 - 5.11 (MIL/uL)   Hemoglobin 13.6  12.0 - 15.0 (g/dL)   HCT 78.2  95.6 - 21.3 (%)   MCV 88.5  78.0 - 100.0 (fL)   MCH 29.5  26.0 - 34.0 (pg)   MCHC 33.3  30.0 - 36.0 (g/dL)   RDW 16.1  09.6 - 04.5 (%)   Platelets 314  150 - 400 (K/uL)  BASIC METABOLIC PANEL      Component Value Range   Sodium 138  135 - 145 (mEq/L)   Potassium 4.2  3.5 - 5.1 (mEq/L)   Chloride 101  96 - 112 (mEq/L)   CO2 23  19 - 32 (mEq/L)   Glucose, Bld 85  70 - 99 (mg/dL)   BUN 6  6 - 23 (mg/dL)   Creatinine, Ser 4.09 (*) 0.50 - 1.10 (mg/dL)   Calcium 81.1  8.4 - 10.5 (mg/dL)   GFR calc non Af Amer >60  >60 (mL/min)   GFR calc Af Amer >60  >60 (mL/min)  LIPASE, BLOOD      Component Value Range   Lipase 23  11 - 59 (U/L)  URINALYSIS, ROUTINE W  REFLEX MICROSCOPIC      Component Value Range   Color, Urine YELLOW  YELLOW    Appearance HAZY (*) CLEAR    Specific Gravity, Urine 1.010  1.005 - 1.030    pH 7.5  5.0 - 8.0    Glucose, UA NEGATIVE  NEGATIVE (mg/dL)   Hgb urine dipstick LARGE (*) NEGATIVE    Bilirubin Urine NEGATIVE  NEGATIVE    Ketones, ur NEGATIVE  NEGATIVE (mg/dL)   Protein, ur NEGATIVE  NEGATIVE (mg/dL)   Urobilinogen, UA 0.2  0.0 - 1.0 (mg/dL)   Nitrite NEGATIVE  NEGATIVE    Leukocytes, UA NEGATIVE  NEGATIVE   PREGNANCY, URINE      Component Value Range   Preg Test, Ur NEGATIVE    URINE MICROSCOPIC-ADD ON      Component Value Range   Squamous Epithelial / LPF FEW (*) RARE    WBC, UA 3-6  <3 (WBC/hpf)   RBC / HPF 7-10  <3 (RBC/hpf)   Bacteria, UA FEW (*) RARE   HEPATIC FUNCTION PANEL      Component Value Range   Total Protein 7.9  6.0 - 8.3 (g/dL)   Albumin 4.1  3.5 - 5.2 (g/dL)   AST 10  0 - 37 (U/L)   ALT 15  0 - 35 (U/L)   Alkaline Phosphatase 86  39 - 117 (U/L)   Total Bilirubin 0.2 (*) 0.3 - 1.2 (mg/dL)   Bilirubin, Direct 0.1  0.0 - 0.3 (mg/dL)   Indirect Bilirubin 0.1 (*) 0.3 - 0.9 (mg/dL)     ED COURSE / COORDINATION OF CARE: 14:09. Initial orders placed prior to my assessment: CBC, BMP, lipase, U/A,  U-preg with micro add on. 15:49. Assessed patient and ordered hepatic function panel. 16:30. Ordered Morphine 4mg  with Zofran 4mg  17:01. Rechecked patient who does not feel improved now (meds administered 24 minutes ago). She reports that the patient noticed two clots of blood pass when she voided her bladder. Her mother is at the bedside now and describes frustration with the persistence of the patient's pain over the last months and states that her OB has not been helpful as she had hoped. Ordered pelvic exam per request.   MDM: PT with normal labs. Nonspecific RUQ pain in a patient who is s/p cholecystectomy. No need for imaging. Abdomen is benign. Anticipate discharged with PCP followup.     Scribe Attestation I personally performed the services described in the documentation, which were scribed in my presence. The recorded information has been reviewed and considered.  Pelvic exam: normal external genitalia, vulva, vagina, cervix, uterus and adnexa, VAGINA: normal appearing vagina with normal color and discharge, no lesions, CERVIX: normal appearing cervix without discharge or lesions, no discharge noted, ADNEXA: normal adnexa in size, nontender and no masses, no masses. Mirena IUD strings in proper position.   Normal Wet prep, chronic nonspecific abdominal pain with benign exam and normal lab workup. No need for imaging. Will d/c with PCP followup.   Derrel Moore B. Bernette Mayers, MD 04/17/11 1900

## 2011-04-17 NOTE — ED Notes (Signed)
Resting quietly in bed. Mother at bedside. PT comfortable at this time.

## 2011-04-17 NOTE — ED Notes (Signed)
Abdominal pain x 3 days, states he has had pain ever since March  When she had a baby. States the pain has gotten worse over the last 3 days

## 2011-04-18 LAB — GC/CHLAMYDIA PROBE AMP, GENITAL
Chlamydia, DNA Probe: NEGATIVE
GC Probe Amp, Genital: NEGATIVE

## 2011-05-28 LAB — DIFFERENTIAL
Basophils Absolute: 0
Basophils Relative: 0
Eosinophils Absolute: 0.1
Neutro Abs: 11.1 — ABNORMAL HIGH
Neutrophils Relative %: 91 — ABNORMAL HIGH

## 2011-05-28 LAB — CBC
MCHC: 35
MCV: 87.6
Platelets: 419 — ABNORMAL HIGH
WBC: 12.2

## 2011-06-07 LAB — URINALYSIS, ROUTINE W REFLEX MICROSCOPIC
Ketones, ur: NEGATIVE mg/dL
Nitrite: NEGATIVE
Protein, ur: NEGATIVE mg/dL
Urobilinogen, UA: 0.2 mg/dL (ref 0.0–1.0)

## 2011-06-07 LAB — DIFFERENTIAL
Basophils Absolute: 0.1 10*3/uL (ref 0.0–0.1)
Basophils Relative: 1 % (ref 0–1)
Monocytes Relative: 6 % (ref 3–11)
Neutro Abs: 9.8 10*3/uL — ABNORMAL HIGH (ref 1.7–8.0)
Neutrophils Relative %: 74 % — ABNORMAL HIGH (ref 43–71)

## 2011-06-07 LAB — WET PREP, GENITAL: WBC, Wet Prep HPF POC: NONE SEEN

## 2011-06-07 LAB — CBC
HCT: 39.5 % (ref 36.0–49.0)
Hemoglobin: 13.5 g/dL (ref 12.0–16.0)
MCHC: 34.1 g/dL (ref 31.0–37.0)
MCV: 87.7 fL (ref 78.0–98.0)
RDW: 13.5 % (ref 11.4–15.5)

## 2011-06-07 LAB — COMPREHENSIVE METABOLIC PANEL
Alkaline Phosphatase: 64 U/L (ref 47–119)
BUN: 11 mg/dL (ref 6–23)
Creatinine, Ser: 0.54 mg/dL (ref 0.4–1.2)
Glucose, Bld: 103 mg/dL — ABNORMAL HIGH (ref 70–99)
Potassium: 3.8 mEq/L (ref 3.5–5.1)
Total Protein: 7.3 g/dL (ref 6.0–8.3)

## 2011-06-07 LAB — PREGNANCY, URINE: Preg Test, Ur: NEGATIVE

## 2011-06-07 LAB — RPR: RPR Ser Ql: NONREACTIVE

## 2011-06-07 LAB — GC/CHLAMYDIA PROBE AMP, GENITAL: Chlamydia, DNA Probe: NEGATIVE

## 2011-06-28 ENCOUNTER — Emergency Department (HOSPITAL_COMMUNITY): Payer: Self-pay

## 2011-06-28 ENCOUNTER — Encounter (HOSPITAL_COMMUNITY): Payer: Self-pay

## 2011-06-28 ENCOUNTER — Emergency Department (HOSPITAL_COMMUNITY)
Admission: EM | Admit: 2011-06-28 | Discharge: 2011-06-28 | Disposition: A | Discharge status: Home / Self Care | Payer: Self-pay | Attending: Emergency Medicine | Admitting: Emergency Medicine

## 2011-06-28 DIAGNOSIS — W19XXXA Unspecified fall, initial encounter: Secondary | ICD-10-CM | POA: Insufficient documentation

## 2011-06-28 DIAGNOSIS — S91009A Unspecified open wound, unspecified ankle, initial encounter: Secondary | ICD-10-CM | POA: Insufficient documentation

## 2011-06-28 DIAGNOSIS — W540XXA Bitten by dog, initial encounter: Secondary | ICD-10-CM | POA: Insufficient documentation

## 2011-06-28 DIAGNOSIS — T148XXA Other injury of unspecified body region, initial encounter: Secondary | ICD-10-CM

## 2011-06-28 DIAGNOSIS — S81009A Unspecified open wound, unspecified knee, initial encounter: Secondary | ICD-10-CM | POA: Insufficient documentation

## 2011-06-28 DIAGNOSIS — Z23 Encounter for immunization: Secondary | ICD-10-CM | POA: Insufficient documentation

## 2011-06-28 DIAGNOSIS — M25539 Pain in unspecified wrist: Secondary | ICD-10-CM | POA: Insufficient documentation

## 2011-06-28 DIAGNOSIS — K219 Gastro-esophageal reflux disease without esophagitis: Secondary | ICD-10-CM | POA: Insufficient documentation

## 2011-06-28 HISTORY — DX: Gastro-esophageal reflux disease without esophagitis: K21.9

## 2011-06-28 MED ORDER — TRAMADOL HCL 50 MG PO TABS: 50.0000 mg | ORAL_TABLET | Freq: Four times a day (QID) | ORAL | Status: AC | PRN

## 2011-06-28 MED ORDER — TETANUS-DIPHTH-ACELL PERTUSSIS 5-2.5-18.5 LF-MCG/0.5 IM SUSP
0.5000 mL | Freq: Once | INTRAMUSCULAR | Status: AC
  Administered 2011-06-28: 0.5 mL via INTRAMUSCULAR
  Filled 2011-06-28: qty 0.5

## 2011-06-28 MED ORDER — AMOXICILLIN-POT CLAVULANATE 875-125 MG PO TABS: 1.0000 | ORAL_TABLET | Freq: Two times a day (BID) | ORAL | Status: AC

## 2011-06-28 MED ORDER — MELOXICAM 7.5 MG PO TABS: ORAL_TABLET | ORAL | Status: DC

## 2011-06-28 MED ORDER — ONDANSETRON HCL 4 MG PO TABS
4.0000 mg | ORAL_TABLET | Freq: Once | ORAL | Status: AC
  Administered 2011-06-28: 4 mg via ORAL
  Filled 2011-06-28: qty 1

## 2011-06-28 MED ORDER — AMOXICILLIN-POT CLAVULANATE 875-125 MG PO TABS
1.0000 | ORAL_TABLET | Freq: Once | ORAL | Status: AC
  Administered 2011-06-28: 1 via ORAL
  Filled 2011-06-28: qty 1

## 2011-06-28 NOTE — ED Notes (Signed)
[facility] advised pt is leaving, given contact information for pt via c-comm

## 2011-06-28 NOTE — ED Provider Notes (Signed)
Medical screening examination/treatment/procedure(s) were performed by non-physician practitioner and as supervising physician I was immediately available for consultation/collaboration.   Joya Gaskins, MD 06/28/11 Zollie Pee

## 2011-06-28 NOTE — ED Provider Notes (Signed)
History     CSN: [id number] Arrival date & time: 06/28/2011  4:37 PM   First MD Initiated Contact with Patient 06/28/11 1717      Chief Complaint  Patient presents with  . Animal Bite    (Consider location/radiation/quality/duration/timing/severity/associated sxs/prior treatment) HPI Comments: Pt states she sustained a dog bite to the right leg last night 10/25. She states the dog is owned by the friend or a friend. She reports the dog is up to date with shots. While getting away from the dog, she fell and injured the left wrist.  Patient is a 20 y.o. female presenting with animal bite. The history is provided by the patient.  Animal Bite  The incident occurred yesterday. The incident occurred at another residence. She came to the ER via personal transport. There is an injury to the right lower leg. It is unlikely that a foreign body is present. Pertinent negatives include no chest pain, no numbness, no abdominal pain, no nausea, no vomiting, no inability to bear weight, no neck pain, no seizures and no cough. There have been no prior injuries to these areas. She is right-handed. Her tetanus status is out of date. She has been behaving normally.    Past Medical History  Diagnosis Date  . Depression     panic attacks  . Anxiety   . Hearing loss   . GERD (gastroesophageal reflux disease)     Past Surgical History  Procedure Date  . Cholecystectomy   . Anoidectomy     History reviewed. No pertinent family history.  History  Substance Use Topics  . Smoking status: Current Everyday Smoker -- 0.5 packs/day    Types: Cigarettes  . Smokeless tobacco: Not on file  . Alcohol Use: No    OB History    Grav Para Term Preterm Abortions TAB SAB Ect Mult Living                  Review of Systems  Constitutional: Negative for activity change.       All ROS Neg except as noted in HPI  HENT: Negative for nosebleeds and neck pain.   Eyes: Negative for photophobia and discharge.    Respiratory: Negative for cough, shortness of breath and wheezing.   Cardiovascular: Negative for chest pain and palpitations.  Gastrointestinal: Negative for nausea, vomiting, abdominal pain and blood in stool.  Genitourinary: Negative for dysuria, frequency and hematuria.  Musculoskeletal: Negative for back pain and arthralgias.  Skin: Negative.   Neurological: Negative for dizziness, seizures, speech difficulty and numbness.  Psychiatric/Behavioral: Negative for hallucinations and confusion.    Allergies  Review of patient's allergies indicates no known allergies.  Home Medications   Current Outpatient Rx  Name Route Sig Dispense Refill  . CALCIUM CARBONATE ANTACID 750 MG PO CHEW Oral Chew 2 tablets by mouth 4 (four) times daily as needed. For heartburn relief     . OMEPRAZOLE 40 MG PO CPDR Oral Take 40 mg by mouth daily.      Marland Kitchen MIRENA IU Intrauterine by Intrauterine route.      . TRAMADOL HCL PO Oral Take 50 mg by mouth. 2 tablets four times daily prn       BP 140/92  Pulse 88  Temp(Src) 98.3 F (36.8 C) (Oral)  Resp 17  Ht 5' (1.524 m)  Wt 171 lb (77.565 kg)  BMI 33.40 kg/m2  SpO2 100%  LMP 06/28/2011  Physical Exam  Nursing note and vitals reviewed. Constitutional: She is  oriented to person, place, and time. She appears well-developed and well-nourished.  Non-toxic appearance.  HENT:  Head: Normocephalic.  Right Ear: Tympanic membrane and external ear normal.  Left Ear: Tympanic membrane and external ear normal.  Eyes: EOM and lids are normal. Pupils are equal, round, and reactive to light.  Neck: Normal range of motion. Neck supple. Carotid bruit is not present.  Cardiovascular: Normal rate, regular rhythm, normal heart sounds, intact distal pulses and normal pulses.   Pulmonary/Chest: Breath sounds normal. No respiratory distress.  Abdominal: Soft. Bowel sounds are normal. There is no tenderness. There is no guarding.  Musculoskeletal: Normal range of motion.        Soreness of the left wrist with palpation and attempted ROM. No deformity. Distal pulse and sensory wnl.  3 shallow punctures and 1 abrasion of the right lateral lower leg. No bone or tendon involvement.  Lymphadenopathy:       Head (right side): No submandibular adenopathy present.       Head (left side): No submandibular adenopathy present.    She has no cervical adenopathy.  Neurological: She is alert and oriented to person, place, and time. She has normal strength. No cranial nerve deficit or sensory deficit.  Skin: Skin is warm and dry.  Psychiatric: She has a normal mood and affect. Her speech is normal.    ED Course  Procedures (including critical care time)  Labs Reviewed - No data to display Dg Wrist Complete Left  06/28/2011  *RADIOLOGY REPORT*  Clinical Data: Left wrist pain post fall  LEFT WRIST - COMPLETE 3+ VIEW  Comparison: 01/11/2011  Findings: Four views of the left wrist submitted.  No acute fracture or subluxation.  No radiopaque foreign body.  IMPRESSION: No acute fracture or subluxation.  Original Report Authenticated By: Natasha Mead, M.D.     No diagnosis found.    MDM  No fracture or dislocaton of the left wrist. No bone or tendon involvement of the left lower ext. . Tetanus given. Safe for pt to be discharged. Sheriff will visit the home.        Kathie Dike, [place] 06/28/11 [phone]

## 2011-06-28 NOTE — ED Notes (Signed)
Pt states that she was bitten by a friend's dog last pm, pt has several puncture marks to right lower leg, pt states that she fell last night as well, pain to left wrist area, cms intact, pt states that the dog is known to her and has had his shots, is attempting to obtain address to report dog bite.

## 2011-06-28 NOTE — ED Notes (Signed)
Pt contact number is [phone],

## 2011-06-28 NOTE — ED Notes (Signed)
Pt presents with dog bite to right leg. Pt knows dog and dog has had shots. 3 wounds noted to right calf. Pt states wounds wont stop bleeding. Pt states she fell when dog attacked her and hurt her left wrist. NAD at this time. Pt ambulated to triage with steady gate.

## 2011-09-14 ENCOUNTER — Emergency Department (HOSPITAL_COMMUNITY)
Admission: EM | Admit: 2011-09-14 | Discharge: 2011-09-15 | Disposition: A | Discharge status: Home / Self Care | Payer: Self-pay | Attending: Emergency Medicine | Admitting: Emergency Medicine

## 2011-09-14 ENCOUNTER — Encounter (HOSPITAL_COMMUNITY): Payer: Self-pay | Admitting: *Deleted

## 2011-09-14 DIAGNOSIS — M542 Cervicalgia: Secondary | ICD-10-CM | POA: Insufficient documentation

## 2011-09-14 DIAGNOSIS — F341 Dysthymic disorder: Secondary | ICD-10-CM | POA: Insufficient documentation

## 2011-09-14 DIAGNOSIS — N390 Urinary tract infection, site not specified: Secondary | ICD-10-CM | POA: Insufficient documentation

## 2011-09-14 DIAGNOSIS — R102 Pelvic and perineal pain: Secondary | ICD-10-CM

## 2011-09-14 DIAGNOSIS — N949 Unspecified condition associated with female genital organs and menstrual cycle: Secondary | ICD-10-CM | POA: Insufficient documentation

## 2011-09-14 DIAGNOSIS — N898 Other specified noninflammatory disorders of vagina: Secondary | ICD-10-CM | POA: Insufficient documentation

## 2011-09-14 DIAGNOSIS — R109 Unspecified abdominal pain: Secondary | ICD-10-CM | POA: Insufficient documentation

## 2011-09-14 DIAGNOSIS — K219 Gastro-esophageal reflux disease without esophagitis: Secondary | ICD-10-CM | POA: Insufficient documentation

## 2011-09-14 DIAGNOSIS — M549 Dorsalgia, unspecified: Secondary | ICD-10-CM | POA: Insufficient documentation

## 2011-09-14 HISTORY — DX: Calculus of kidney: N20.0

## 2011-09-14 LAB — URINE MICROSCOPIC-ADD ON

## 2011-09-14 LAB — URINALYSIS, ROUTINE W REFLEX MICROSCOPIC
Bilirubin Urine: NEGATIVE
Ketones, ur: NEGATIVE mg/dL
Nitrite: NEGATIVE
Specific Gravity, Urine: 1.02 (ref 1.005–1.030)
Urobilinogen, UA: 0.2 mg/dL (ref 0.0–1.0)

## 2011-09-14 NOTE — ED Notes (Signed)
Report given to Aileen Pilot, RN.

## 2011-09-14 NOTE — ED Notes (Signed)
Pt states has lower abd and lower back pain. Pt believes it is secondary to a meriena (IUD) placement. Pt states has had spotting vaginal discharge 2 days ago.  Pt denies n/v, fever, chills and painful sex.

## 2011-09-15 MED ORDER — HYDROCODONE-ACETAMINOPHEN 5-325 MG PO TABS: 1.0000 | ORAL_TABLET | Freq: Four times a day (QID) | ORAL | Status: AC | PRN

## 2011-09-15 MED ORDER — CIPROFLOXACIN HCL 500 MG PO TABS: 500.0000 mg | ORAL_TABLET | Freq: Two times a day (BID) | ORAL | Status: AC

## 2011-09-15 NOTE — ED Provider Notes (Signed)
History     CSN: [id number]  Arrival date & time 09/14/11  2141   First MD Initiated Contact with Patient 09/14/11 2309      Chief Complaint  Patient presents with  . Abdominal Pain    (Consider location/radiation/quality/duration/timing/severity/associated sxs/prior treatment) Patient is a 21 y.o. female presenting with abdominal pain. The history is provided by the patient.  Abdominal Pain The primary symptoms of the illness include abdominal pain and vaginal bleeding. The primary symptoms of the illness do not include fever, shortness of breath, nausea, vomiting, diarrhea, dysuria or vaginal discharge. The current episode started yesterday.  The patient states that she believes she is currently not pregnant. The patient has not had a change in bowel habit. Additional symptoms associated with the illness include back pain. Symptoms associated with the illness do not include chills, anorexia, diaphoresis, constipation, urgency, hematuria or frequency.   [facility] followed [facility] in the past for a complaint of mild to moderate intermittent dull the pain since March of 2012. Her last delivery was during that time. Patient feels his symptoms have gotten worse starting yesterday with movement of the pain to her back no sitting in change in the lower abdominal pelvic pain no nausea no vomiting no fever no chills intermittent vaginal spotting with irregular periods since March patient had IUD placed in November she is concerned that is not in the right position and may be causing some of the increased discomfort and/or bleeding   Past Medical History  Diagnosis Date  . Depression     panic attacks  . Anxiety   . Hearing loss   . GERD (gastroesophageal reflux disease)   . Kidney stones     Past Surgical History  Procedure Date  . Cholecystectomy   . Anoidectomy   . Tonsillectomy     No family history on file.  History  Substance Use Topics  . Smoking status: Current  Everyday Smoker -- 0.5 packs/day    Types: Cigarettes  . Smokeless tobacco: Not on file  . Alcohol Use: No    OB History    Grav Para Term Preterm Abortions TAB SAB Ect Mult Living                  Review of Systems  Constitutional: Negative for fever, chills and diaphoresis.  HENT: Positive for neck pain. Negative for congestion and neck stiffness.   Eyes: Negative for pain and visual disturbance.  Respiratory: Negative for shortness of breath.   Gastrointestinal: Positive for abdominal pain. Negative for nausea, vomiting, diarrhea, constipation and anorexia.  Genitourinary: Positive for vaginal bleeding. Negative for dysuria, urgency, frequency, hematuria and vaginal discharge.  Musculoskeletal: Positive for back pain.  Skin: Negative for rash.  Neurological: Negative for headaches.  Hematological: Does not bruise/bleed easily.    Allergies  Review of patient's allergies indicates no known allergies.  Home Medications   Current Outpatient Rx  Name Route Sig Dispense Refill  . CALCIUM CARBONATE ANTACID 750 MG PO CHEW Oral Chew 2 tablets by mouth 4 (four) times daily as needed. For heartburn relief     . CIPROFLOXACIN HCL 500 MG PO TABS Oral Take 1 tablet (500 mg total) by mouth every 12 (twelve) hours. 10 tablet 0  . HYDROCODONE-ACETAMINOPHEN 5-325 MG PO TABS Oral Take 1-2 tablets by mouth every 6 (six) hours as needed for pain. 10 tablet 0  . MIRENA IU Intrauterine by Intrauterine route.      . MELOXICAM 7.5 MG  PO TABS  1 po bid with food 14 tablet 0  . OMEPRAZOLE 40 MG PO CPDR Oral Take 40 mg by mouth daily.      . TRAMADOL HCL PO Oral Take 50 mg by mouth. 2 tablets four times daily prn       BP 133/86  Pulse 90  Temp(Src) 98.3 F (36.8 C) (Oral)  Resp 20  Ht 5' (1.524 m)  Wt 167 lb (75.751 kg)  BMI 32.62 kg/m2  SpO2 97%  LMP 09/12/2011  Physical Exam  Nursing note and vitals reviewed. Constitutional: She is oriented to person, place, and time. She appears  well-developed and well-nourished. No distress.  HENT:  Head: Normocephalic and atraumatic.  Mouth/Throat: Oropharynx is clear and moist.  Eyes: Conjunctivae and EOM are normal. Pupils are equal, round, and reactive to light.  Neck: Normal range of motion. Neck supple.  Cardiovascular: Normal rate, regular rhythm and normal heart sounds.   No murmur heard. Pulmonary/Chest: Effort normal and breath sounds normal.  Abdominal: Soft. Bowel sounds are normal. There is no tenderness.  Genitourinary: Vagina normal and uterus normal. No vaginal discharge found.       Pelvic exam without significant cervical motion tenderness IUD string noted palpation of cervix without any sniffing abnormalities no significant discharge no adnexal tenderness very mild slight uterine tenderness, no vaginal bleeding or discharge.   Musculoskeletal: Normal range of motion. She exhibits no edema.  Neurological: She is alert and oriented to person, place, and time. No cranial nerve deficit. She exhibits normal muscle tone. Coordination normal.  Skin: Skin is warm. No rash noted.    ED Course  Procedures (including critical care time)  Labs Reviewed  URINALYSIS, ROUTINE W REFLEX MICROSCOPIC - Abnormal; Notable for the following:    APPearance CLOUDY (*)    Leukocytes, UA TRACE (*)    All other components within normal limits  URINE MICROSCOPIC-ADD ON - Abnormal; Notable for the following:    Squamous Epithelial / LPF MANY (*)    Bacteria, UA MANY (*)    All other components within normal limits  PREGNANCY, URINE   No results found.   1. Urinary tract infection   2. Chronic pelvic pain in female       MDM   The patient with history of pelvic pain mild to moderate his delivery of her last child in March in November had placement of an IUD she's concerned that this causing some spotting at times and some increased pain. Pelvic exam showed no abnormal position of the IUD cervix was nontender to motion adnexa  nontender ovaries nontender and only mild uterine tenderness clinically probably not relevant. Urinalysis with questionable contamination but will treat as if UTI with Cipro. [facility]. Patient will return of symptoms or new or worse. Based on today's exam not concerned about PID, no significant vaginal discharge. During pelvic examination wet prep and cultures were not performed based on the exam but concerning for discharge or infection.        Shelda Jakes, MD 09/15/11 [phone]

## 2012-03-06 ENCOUNTER — Encounter (HOSPITAL_COMMUNITY): Payer: Self-pay | Admitting: Emergency Medicine

## 2012-03-06 ENCOUNTER — Emergency Department (HOSPITAL_COMMUNITY)
Admission: EM | Admit: 2012-03-06 | Discharge: 2012-03-06 | Disposition: A | Discharge status: Home / Self Care | Payer: Self-pay | Attending: Emergency Medicine | Admitting: Emergency Medicine

## 2012-03-06 DIAGNOSIS — F3289 Other specified depressive episodes: Secondary | ICD-10-CM | POA: Insufficient documentation

## 2012-03-06 DIAGNOSIS — F411 Generalized anxiety disorder: Secondary | ICD-10-CM | POA: Insufficient documentation

## 2012-03-06 DIAGNOSIS — F17200 Nicotine dependence, unspecified, uncomplicated: Secondary | ICD-10-CM | POA: Insufficient documentation

## 2012-03-06 DIAGNOSIS — S61209A Unspecified open wound of unspecified finger without damage to nail, initial encounter: Secondary | ICD-10-CM | POA: Insufficient documentation

## 2012-03-06 DIAGNOSIS — Z9089 Acquired absence of other organs: Secondary | ICD-10-CM | POA: Insufficient documentation

## 2012-03-06 DIAGNOSIS — IMO0002 Reserved for concepts with insufficient information to code with codable children: Secondary | ICD-10-CM

## 2012-03-06 DIAGNOSIS — Z87442 Personal history of urinary calculi: Secondary | ICD-10-CM | POA: Insufficient documentation

## 2012-03-06 DIAGNOSIS — K219 Gastro-esophageal reflux disease without esophagitis: Secondary | ICD-10-CM | POA: Insufficient documentation

## 2012-03-06 DIAGNOSIS — W268XXA Contact with other sharp object(s), not elsewhere classified, initial encounter: Secondary | ICD-10-CM | POA: Insufficient documentation

## 2012-03-06 DIAGNOSIS — F329 Major depressive disorder, single episode, unspecified: Secondary | ICD-10-CM | POA: Insufficient documentation

## 2012-03-06 NOTE — ED Provider Notes (Signed)
History     CSN: [id number]  Arrival date & time 03/06/12  0820   First MD Initiated Contact with Patient 03/06/12 [phone]      Chief Complaint  Patient presents with  . Laceration    (Consider location/radiation/quality/duration/timing/severity/associated sxs/prior treatment) Patient is a 21 y.o. female presenting with skin laceration. The history is provided by the patient.  Laceration    the patient is a 21 year old, right-hand dominant female, who presents to emergency department complaining of a laceration to her left thumb.  She was at work breaking down a box and cut her thumb.  She has no other injuries.  Her last tetanus shot was last year.  She denies allergies to medications.  Past Medical History  Diagnosis Date  . Depression     panic attacks  . Anxiety   . Hearing loss   . GERD (gastroesophageal reflux disease)   . Kidney stones     Past Surgical History  Procedure Date  . Cholecystectomy   . Anoidectomy   . Tonsillectomy     History reviewed. No pertinent family history.  History  Substance Use Topics  . Smoking status: Current Everyday Smoker -- 0.5 packs/day    Types: Cigarettes  . Smokeless tobacco: Not on file  . Alcohol Use: No    OB History    Grav Para Term Preterm Abortions TAB SAB Ect Mult Living                  Review of Systems  Musculoskeletal:       Laceration to left thumb  All other systems reviewed and are negative.    Allergies  Review of patient's allergies indicates no known allergies.  Home Medications   Current Outpatient Rx  Name Route Sig Dispense Refill  . CALCIUM CARBONATE ANTACID 750 MG PO CHEW Oral Chew 2 tablets by mouth 4 (four) times daily as needed. For heartburn relief     . MIRENA IU Intrauterine by Intrauterine route.      . MELOXICAM 7.5 MG PO TABS  1 po bid with food 14 tablet 0  . OMEPRAZOLE 40 MG PO CPDR Oral Take 40 mg by mouth daily.      . TRAMADOL HCL PO Oral Take 50 mg by mouth. 2 tablets  four times daily prn       BP 135/99  Pulse 99  Temp 98.1 F (36.7 C) (Oral)  Resp 18  SpO2 100%  LMP 02/21/2012  Physical Exam  Nursing note and vitals reviewed. Constitutional: She is oriented to person, place, and time. She appears well-developed and well-nourished.  HENT:  Head: Atraumatic.  Eyes: EOM are normal.  Neck: Normal range of motion.  Pulmonary/Chest: Effort normal.  Abdominal: She exhibits no distension.  Musculoskeletal:       1 cm clean laceration on the long axis of the distal phalanx of the left thumb on the volar surface, with mild bleeding  Neurological: She is alert and oriented to person, place, and time.  Skin: Skin is warm and dry.  Psychiatric: She has a normal mood and affect. Thought content normal.    ED Course  Procedures (including critical care time) left thumb distal phalanx laceration  Labs Reviewed - No data to display No results found.   No diagnosis found.  LACERATION REPAIR Performed by: Nicholes Stairs Authorized by: Nicholes Stairs Consent: Verbal consent obtained. Risks and benefits: risks, benefits and alternatives were discussed Consent given by: patient Patient identity confirmed: provided  demographic data Prepped and Draped in normal sterile fashion Wound explored  Laceration Location: left thumb distal phalanx, volar surface  Laceration Length: 1 cm  No Foreign Bodies seen or palpated  Anesthesia: local infiltration  Local anesthetic: lidocaine 1% no epinephrine  Anesthetic total: 1 ml  Irrigation method: syringe Amount of cleaning: standard  Skin closure: simple interrupted  Number of sutures: 3  Technique: simple interrupted 4-0 proline  Patient tolerance: Patient tolerated the procedure well with no immediate complications.  MDM  Left thumb laceration 1 cm closed without complications        Cheri Guppy, MD 03/06/12 [phone]

## 2012-03-06 NOTE — ED Notes (Signed)
Pt cut thumb of left hand while cutting open a box at work this am.

## 2012-04-23 ENCOUNTER — Emergency Department (HOSPITAL_COMMUNITY)
Admission: EM | Admit: 2012-04-23 | Discharge: 2012-04-23 | Disposition: A | Discharge status: Home / Self Care | Payer: Self-pay | Attending: Emergency Medicine | Admitting: Emergency Medicine

## 2012-04-23 ENCOUNTER — Encounter (HOSPITAL_COMMUNITY): Payer: Self-pay

## 2012-04-23 DIAGNOSIS — F411 Generalized anxiety disorder: Secondary | ICD-10-CM | POA: Insufficient documentation

## 2012-04-23 DIAGNOSIS — F329 Major depressive disorder, single episode, unspecified: Secondary | ICD-10-CM | POA: Insufficient documentation

## 2012-04-23 DIAGNOSIS — K0381 Cracked tooth: Secondary | ICD-10-CM | POA: Insufficient documentation

## 2012-04-23 DIAGNOSIS — K219 Gastro-esophageal reflux disease without esophagitis: Secondary | ICD-10-CM | POA: Insufficient documentation

## 2012-04-23 DIAGNOSIS — F3289 Other specified depressive episodes: Secondary | ICD-10-CM | POA: Insufficient documentation

## 2012-04-23 DIAGNOSIS — F17200 Nicotine dependence, unspecified, uncomplicated: Secondary | ICD-10-CM | POA: Insufficient documentation

## 2012-04-23 DIAGNOSIS — K029 Dental caries, unspecified: Secondary | ICD-10-CM | POA: Insufficient documentation

## 2012-04-23 MED ORDER — IBUPROFEN 600 MG PO TABS: 600.0000 mg | ORAL_TABLET | Freq: Four times a day (QID) | ORAL | Status: AC | PRN

## 2012-04-23 MED ORDER — HYDROCODONE-ACETAMINOPHEN 5-325 MG PO TABS: 1.0000 | ORAL_TABLET | ORAL | Status: AC | PRN

## 2012-04-23 MED ORDER — AMOXICILLIN 500 MG PO CAPS: 500.0000 mg | ORAL_CAPSULE | Freq: Three times a day (TID) | ORAL | Status: AC

## 2012-04-23 NOTE — ED Provider Notes (Signed)
Medical screening examination/treatment/procedure(s) were performed by non-physician practitioner and as supervising physician I was immediately available for consultation/collaboration.   Carleene Cooper III, MD 04/23/12 1910

## 2012-04-23 NOTE — ED Provider Notes (Signed)
History     CSN: [id number]  Arrival date & time 04/23/12  4098   First MD Initiated Contact with Patient 04/23/12 [phone]      Chief Complaint  Patient presents with  . Dental Pain    (Consider location/radiation/quality/duration/timing/severity/associated sxs/prior treatment) HPI Comments: Rachel Meadows  presents with a 1 day history of dental pain.  She has decay in her right lower 2nd molar tooth and yesterday when eating biscuit,  A large portion of tooth broke off causing severe constant throbbing pain without radiation.   There has been no fevers,  Chills, nausea or vomiting, also no complaint of difficulty swallowing,  Although chewing makes pain worse.  The patient has tried ibuprofen without relief of symptoms.      The history is provided by the patient.    Past Medical History  Diagnosis Date  . Depression     panic attacks  . Anxiety   . Hearing loss   . GERD (gastroesophageal reflux disease)   . Kidney stones     Past Surgical History  Procedure Date  . Cholecystectomy   . Anoidectomy     adenoidectomy  . Tonsillectomy     No family history on file.  History  Substance Use Topics  . Smoking status: Current Everyday Smoker -- 0.5 packs/day    Types: Cigarettes  . Smokeless tobacco: Not on file  . Alcohol Use: No    OB History    Grav Para Term Preterm Abortions TAB SAB Ect Mult Living                  Review of Systems  Constitutional: Negative for fever.  HENT: Positive for dental problem. Negative for sore throat, facial swelling, neck pain and neck stiffness.   Respiratory: Negative for shortness of breath.     Allergies  Review of patient's allergies indicates no known allergies.  Home Medications   Current Outpatient Rx  Name Route Sig Dispense Refill  . AMOXICILLIN 500 MG PO CAPS Oral Take 1 capsule (500 mg total) by mouth 3 (three) times daily. 30 capsule 0  . HYDROCODONE-ACETAMINOPHEN 5-325 MG PO TABS Oral Take 1 tablet by mouth  every 4 (four) hours as needed for pain. 15 tablet 0  . IBUPROFEN 200 MG PO TABS Oral Take 400 mg by mouth every 6 (six) hours as needed. pain    . IBUPROFEN 600 MG PO TABS Oral Take 1 tablet (600 mg total) by mouth every 6 (six) hours as needed for pain. 20 tablet 0    BP 114/69  Pulse 89  Temp 97.8 F (36.6 C) (Oral)  Resp 18  Ht 5' (1.524 m)  Wt 160 lb (72.576 kg)  BMI 31.25 kg/m2  SpO2 97%  LMP 04/18/2012  Physical Exam  Constitutional: She is oriented to person, place, and time. She appears well-developed and well-nourished. No distress.  HENT:  Head: Normocephalic and atraumatic.  Right Ear: Tympanic membrane and external ear normal.  Left Ear: Tympanic membrane and external ear normal.  Mouth/Throat: Oropharynx is clear and moist and mucous membranes are normal. No oral lesions. Dental abscesses present.    Eyes: Conjunctivae are normal.  Neck: Normal range of motion. Neck supple.  Cardiovascular: Normal rate and normal heart sounds.   Pulmonary/Chest: Effort normal.  Abdominal: She exhibits no distension.  Musculoskeletal: Normal range of motion.  Lymphadenopathy:    She has no cervical adenopathy.  Neurological: She is alert and oriented to person, place, and  time.  Skin: Skin is warm and dry. No erythema.  Psychiatric: She has a normal mood and affect.    ED Course  Procedures (including critical care time)  Labs Reviewed - No data to display No results found.   1. Nontraumatic broken or cracked tooth   2. Dental decay       MDM  Amoxil,  Hydrocodone,  Ibuprofen. Dental referrals given.  The patient appears reasonably screened and/or stabilized for discharge and I doubt any other medical condition or other [facility] requiring further screening, evaluation, or treatment in the ED at this time prior to discharge.         Burgess Amor, [place] 04/23/12 [phone]

## 2012-04-23 NOTE — ED Notes (Signed)
Pt reports broke a tooth yesterday and c/o severe pain.  Took 800mg  ibuprofen this morning around 0800

## 2012-07-25 ENCOUNTER — Encounter (HOSPITAL_COMMUNITY): Payer: Self-pay | Admitting: Emergency Medicine

## 2012-07-25 ENCOUNTER — Emergency Department (HOSPITAL_COMMUNITY)
Admission: EM | Admit: 2012-07-25 | Discharge: 2012-07-25 | Disposition: A | Discharge status: Home / Self Care | Payer: Self-pay | Attending: Emergency Medicine | Admitting: Emergency Medicine

## 2012-07-25 DIAGNOSIS — F17200 Nicotine dependence, unspecified, uncomplicated: Secondary | ICD-10-CM | POA: Insufficient documentation

## 2012-07-25 DIAGNOSIS — H9190 Unspecified hearing loss, unspecified ear: Secondary | ICD-10-CM | POA: Insufficient documentation

## 2012-07-25 DIAGNOSIS — Z87442 Personal history of urinary calculi: Secondary | ICD-10-CM | POA: Insufficient documentation

## 2012-07-25 DIAGNOSIS — K0889 Other specified disorders of teeth and supporting structures: Secondary | ICD-10-CM

## 2012-07-25 DIAGNOSIS — Z8659 Personal history of other mental and behavioral disorders: Secondary | ICD-10-CM | POA: Insufficient documentation

## 2012-07-25 DIAGNOSIS — K089 Disorder of teeth and supporting structures, unspecified: Secondary | ICD-10-CM | POA: Insufficient documentation

## 2012-07-25 MED ORDER — PENICILLIN V POTASSIUM 500 MG PO TABS: 500.0000 mg | ORAL_TABLET | Freq: Four times a day (QID) | ORAL | Status: AC

## 2012-07-25 MED ORDER — HYDROCODONE-ACETAMINOPHEN 5-325 MG PO TABS
1.0000 | ORAL_TABLET | Freq: Once | ORAL | Status: AC
  Administered 2012-07-25: 1 via ORAL
  Filled 2012-07-25: qty 1

## 2012-07-25 MED ORDER — IBUPROFEN 800 MG PO TABS
800.0000 mg | ORAL_TABLET | Freq: Once | ORAL | Status: AC
  Administered 2012-07-25: 800 mg via ORAL
  Filled 2012-07-25: qty 1

## 2012-07-25 MED ORDER — PENICILLIN V POTASSIUM 250 MG PO TABS
500.0000 mg | ORAL_TABLET | Freq: Once | ORAL | Status: AC
  Administered 2012-07-25: 500 mg via ORAL
  Filled 2012-07-25: qty 2

## 2012-07-25 MED ORDER — HYDROCODONE-ACETAMINOPHEN 5-325 MG PO TABS: 1.0000 | ORAL_TABLET | Freq: Four times a day (QID) | ORAL | Status: AC | PRN

## 2012-07-25 NOTE — ED Notes (Signed)
Patient complaining of lower right dental pain since yesterday. Has history of same dental pain but states no dentist will accept her.

## 2012-07-25 NOTE — ED Provider Notes (Signed)
History     CSN: [id number]  Arrival date & time 07/25/12  1928   First MD Initiated Contact with Patient 07/25/12 1958      Chief Complaint  Patient presents with  . Dental Pain    (Consider location/radiation/quality/duration/timing/severity/associated sxs/prior treatment) HPI Comments: R lower dental pain since yest.    No fever or chills.  The history is provided by the patient. No language interpreter was used.    Past Medical History  Diagnosis Date  . Depression     panic attacks  . Anxiety   . Hearing loss   . GERD (gastroesophageal reflux disease)   . Kidney stones     Past Surgical History  Procedure Date  . Cholecystectomy   . Anoidectomy     adenoidectomy  . Tonsillectomy     History reviewed. No pertinent family history.  History  Substance Use Topics  . Smoking status: Current Every Day Smoker -- 0.5 packs/day    Types: Cigarettes  . Smokeless tobacco: Not on file  . Alcohol Use: No    OB History    Grav Para Term Preterm Abortions TAB SAB Ect Mult Living                  Review of Systems  Constitutional: Negative for fever and chills.  HENT: Positive for dental problem. Negative for facial swelling and neck pain.   All other systems reviewed and are negative.    Allergies  Review of patient's allergies indicates no known allergies.  Home Medications   Current Outpatient Rx  Name  Route  Sig  Dispense  Refill  . ACETAMINOPHEN 500 MG PO TABS   Oral   Take 1,000 mg by mouth every 6 (six) hours as needed. pain         . HYDROCODONE-ACETAMINOPHEN 5-325 MG PO TABS   Oral   Take 1 tablet by mouth every 6 (six) hours as needed for pain.   20 tablet   0   . PENICILLIN V POTASSIUM 500 MG PO TABS   Oral   Take 1 tablet (500 mg total) by mouth 4 (four) times daily.   40 tablet   0     BP 126/81  Pulse 100  Temp 97.4 F (36.3 C) (Oral)  Resp 16  Ht 5' (1.524 m)  Wt 154 lb (69.854 kg)  BMI 30.08 kg/m2  SpO2 98%  LMP  06/21/2012  Physical Exam  Nursing note and vitals reviewed. Constitutional: She is oriented to person, place, and time. She appears well-developed and well-nourished. No distress.  HENT:  Head: Normocephalic and atraumatic.  Mouth/Throat:    Eyes: EOM are normal.  Neck: Normal range of motion.  Cardiovascular: Normal rate, regular rhythm and normal heart sounds.   Pulmonary/Chest: Effort normal and breath sounds normal.  Abdominal: Soft. She exhibits no distension. There is no tenderness.  Musculoskeletal: Normal range of motion.  Neurological: She is alert and oriented to person, place, and time.  Skin: Skin is warm and dry.  Psychiatric: She has a normal mood and affect. Judgment normal.    ED Course  Procedures (including critical care time)  Labs Reviewed - No data to display No results found.   1. Pain, dental       MDM  rx-pen VK 500 mg, 40 rx-hydrocodone, 20 Ibuprofen F/u with dentist of your choice.        Evalina Field, [place] 07/25/12 2121  Evalina Field, PA 07/29/12 1610

## 2012-08-04 NOTE — ED Provider Notes (Signed)
Medical screening examination/treatment/procedure(s) were performed by non-physician practitioner and as supervising physician I was immediately available for consultation/collaboration. Devoria Albe, MD, FACEP   Ward Givens, MD 08/04/12 1440

## 2012-11-29 ENCOUNTER — Encounter (HOSPITAL_COMMUNITY): Payer: Self-pay | Admitting: Emergency Medicine

## 2012-11-29 ENCOUNTER — Emergency Department (HOSPITAL_COMMUNITY)
Admission: EM | Admit: 2012-11-29 | Discharge: 2012-11-29 | Disposition: A | Discharge status: Home / Self Care | Payer: Self-pay | Attending: Emergency Medicine | Admitting: Emergency Medicine

## 2012-11-29 DIAGNOSIS — Z8659 Personal history of other mental and behavioral disorders: Secondary | ICD-10-CM | POA: Insufficient documentation

## 2012-11-29 DIAGNOSIS — R51 Headache: Secondary | ICD-10-CM | POA: Insufficient documentation

## 2012-11-29 DIAGNOSIS — H7291 Unspecified perforation of tympanic membrane, right ear: Secondary | ICD-10-CM

## 2012-11-29 DIAGNOSIS — H7290 Unspecified perforation of tympanic membrane, unspecified ear: Secondary | ICD-10-CM | POA: Insufficient documentation

## 2012-11-29 DIAGNOSIS — H9209 Otalgia, unspecified ear: Secondary | ICD-10-CM | POA: Insufficient documentation

## 2012-11-29 DIAGNOSIS — F17200 Nicotine dependence, unspecified, uncomplicated: Secondary | ICD-10-CM | POA: Insufficient documentation

## 2012-11-29 DIAGNOSIS — Z87442 Personal history of urinary calculi: Secondary | ICD-10-CM | POA: Insufficient documentation

## 2012-11-29 DIAGNOSIS — Z8719 Personal history of other diseases of the digestive system: Secondary | ICD-10-CM | POA: Insufficient documentation

## 2012-11-29 DIAGNOSIS — H9190 Unspecified hearing loss, unspecified ear: Secondary | ICD-10-CM | POA: Insufficient documentation

## 2012-11-29 DIAGNOSIS — K029 Dental caries, unspecified: Secondary | ICD-10-CM | POA: Insufficient documentation

## 2012-11-29 MED ORDER — HYDROCODONE-ACETAMINOPHEN 5-325 MG PO TABS: 1.0000 | ORAL_TABLET | ORAL | Status: DC | PRN

## 2012-11-29 MED ORDER — AMOXICILLIN 250 MG PO CAPS
500.0000 mg | ORAL_CAPSULE | Freq: Once | ORAL | Status: AC
Start: 2012-11-29 — End: 2012-11-29
  Administered 2012-11-29: 500 mg via ORAL
  Filled 2012-11-29: qty 2

## 2012-11-29 MED ORDER — OXYCODONE-ACETAMINOPHEN 5-325 MG PO TABS
1.0000 | ORAL_TABLET | Freq: Once | ORAL | Status: AC
  Administered 2012-11-29: 1 via ORAL
  Filled 2012-11-29: qty 1

## 2012-11-29 MED ORDER — NEOMYCIN-POLYMYXIN-HC 3.5-10000-1 OT SOLN
3.0000 [drp] | Freq: Four times a day (QID) | OTIC | Status: DC
  Administered 2012-11-29: 3 [drp] via OTIC
  Filled 2012-11-29: qty 10

## 2012-11-29 MED ORDER — AMOXICILLIN 500 MG PO CAPS: 500.0000 mg | ORAL_CAPSULE | Freq: Three times a day (TID) | ORAL | Status: DC

## 2012-11-29 NOTE — ED Notes (Signed)
Dried blood noted in r ear

## 2012-11-29 NOTE — ED Notes (Signed)
r ear pain and r lower dental pain x 3 days. No obvious swelling.

## 2012-11-29 NOTE — ED Notes (Signed)
nad noted prior to dc. Dc instructions reviewed and 2 scripts given to pt. Pt voiced understanding of f/u care. Ambulated out without difficulty.

## 2012-11-29 NOTE — ED Provider Notes (Signed)
History     CSN: [id number]  Arrival date & time 11/29/12  1652   First MD Initiated Contact with Patient 11/29/12 1707      Chief Complaint  Patient presents with  . Dental Pain  . Otalgia    (Consider location/radiation/quality/duration/timing/severity/associated sxs/prior treatment) HPI Rachel Meadows is a 22 y.o. female who presents to the ED with ear ache and dental pain. The pain started about 4 days ago. She describes the pain as throbbing. The pain is severe. She has a history of ear problems that required tubes in her ears. She has done well for the past 2 years but today the pain increased and the right ear started draining. The dental pain and the ear pain is located on the right. The history was provided by the patient.   Past Medical History  Diagnosis Date  . Depression     panic attacks  . Anxiety   . Hearing loss   . GERD (gastroesophageal reflux disease)   . Kidney stones     Past Surgical History  Procedure Laterality Date  . Cholecystectomy    . Anoidectomy      adenoidectomy  . Tonsillectomy      History reviewed. No pertinent family history.  History  Substance Use Topics  . Smoking status: Current Every Day Smoker -- 0.50 packs/day    Types: Cigarettes  . Smokeless tobacco: Not on file  . Alcohol Use: No    OB History   Grav Para Term Preterm Abortions TAB SAB Ect Mult Living                  Review of Systems  Constitutional: Negative for fever and chills.  HENT: Positive for ear pain. Negative for congestion and neck stiffness.   Eyes: Negative for redness.  Respiratory: Negative for cough.   Cardiovascular: Negative for chest pain.  Gastrointestinal: Negative for nausea, vomiting and abdominal pain.  Skin: Negative for rash.  Allergic/Immunologic: Negative for immunocompromised state.  Neurological: Positive for headaches (on right with ear pain).  Psychiatric/Behavioral: Negative for confusion. Nervous/anxious: hx of anxiety.      Allergies  Review of patient's allergies indicates no known allergies.  Home Medications   Current Outpatient Rx  Name  Route  Sig  Dispense  Refill  . acetaminophen (TYLENOL) 500 MG tablet   Oral   Take 1,000 mg by mouth every 6 (six) hours as needed. pain         . amoxicillin (AMOXIL) 500 MG capsule   Oral   Take 1 capsule (500 mg total) by mouth 3 (three) times daily.   21 capsule   0   . HYDROcodone-acetaminophen (NORCO/VICODIN) 5-325 MG per tablet   Oral   Take 1 tablet by mouth every 4 (four) hours as needed.   20 tablet   0     BP 151/94  Pulse 91  Temp(Src) 97.6 F (36.4 C) (Oral)  Resp 24  Ht 5' (1.524 m)  Wt 150 lb (68.04 kg)  BMI 29.3 kg/m2  SpO2 100%  LMP 11/02/2012  Physical Exam  Nursing note and vitals reviewed. Constitutional: She is oriented to person, place, and time. She appears well-developed and well-nourished. No distress.  HENT:  Head: Normocephalic.  Right Ear: There is drainage. Tympanic membrane is perforated.  Left Ear: Tympanic membrane normal.  Mouth/Throat: Uvula is midline, oropharynx is clear and moist and mucous membranes are normal.    Dental caries and tenderness on exam  Eyes: EOM are normal. Pupils are equal, round, and reactive to light.  Neck: Neck supple.  Cardiovascular: Normal rate.   Pulmonary/Chest: Effort normal.  Musculoskeletal: Normal range of motion. She exhibits no edema.  Neurological: She is alert and oriented to person, place, and time. No cranial nerve deficit.  Skin: Skin is warm and dry.  Psychiatric: She has a normal mood and affect. Her behavior is normal.    ED Course  Procedures (including critical care time)   1. Pain due to dental caries    MDM  22 y.o. female with right TM perforation, dental pain and caries I have reviewed this patient's vital signs, nurses notes and discussed clinical findings with the patient. We will treat her ear and dental problem with Amoxicillin,  cortisporin otic susp and pain medication. She [facility] to be sure the ear is healing well and try and find a dentist. Patient is stable for discharge home to follow up as planned.    Medication List    TAKE these medications       amoxicillin 500 MG capsule  Commonly known as:  AMOXIL  Take 1 capsule (500 mg total) by mouth 3 (three) times daily.     HYDROcodone-acetaminophen 5-325 MG per tablet  Commonly known as:  NORCO/VICODIN  Take 1 tablet by mouth every 4 (four) hours as needed.      ASK your doctor about these medications       acetaminophen 500 MG tablet  Commonly known as:  TYLENOL  Take 1,000 mg by mouth every 6 (six) hours as needed. pain             Janne Napoleon, [place] 11/29/12 [phone]

## 2012-12-02 NOTE — ED Provider Notes (Signed)
Medical screening examination/treatment/procedure(s) were performed by non-physician practitioner and as supervising physician I was immediately available for consultation/collaboration.  Daoud Lobue, MD 12/02/12 1343 

## 2012-12-21 ENCOUNTER — Encounter (HOSPITAL_COMMUNITY): Payer: Self-pay | Admitting: Emergency Medicine

## 2012-12-21 ENCOUNTER — Emergency Department (HOSPITAL_COMMUNITY): Payer: Self-pay

## 2012-12-21 ENCOUNTER — Telehealth (HOSPITAL_COMMUNITY): Payer: Self-pay | Admitting: Emergency Medicine

## 2012-12-21 ENCOUNTER — Emergency Department (HOSPITAL_COMMUNITY)
Admission: EM | Admit: 2012-12-21 | Discharge: 2012-12-21 | Disposition: A | Discharge status: Home / Self Care | Payer: Self-pay | Attending: Emergency Medicine | Admitting: Emergency Medicine

## 2012-12-21 DIAGNOSIS — F411 Generalized anxiety disorder: Secondary | ICD-10-CM | POA: Insufficient documentation

## 2012-12-21 DIAGNOSIS — F329 Major depressive disorder, single episode, unspecified: Secondary | ICD-10-CM | POA: Insufficient documentation

## 2012-12-21 DIAGNOSIS — F3289 Other specified depressive episodes: Secondary | ICD-10-CM | POA: Insufficient documentation

## 2012-12-21 DIAGNOSIS — F17200 Nicotine dependence, unspecified, uncomplicated: Secondary | ICD-10-CM | POA: Insufficient documentation

## 2012-12-21 DIAGNOSIS — H6691 Otitis media, unspecified, right ear: Secondary | ICD-10-CM

## 2012-12-21 DIAGNOSIS — Z9889 Other specified postprocedural states: Secondary | ICD-10-CM | POA: Insufficient documentation

## 2012-12-21 DIAGNOSIS — Z87442 Personal history of urinary calculi: Secondary | ICD-10-CM | POA: Insufficient documentation

## 2012-12-21 DIAGNOSIS — H9190 Unspecified hearing loss, unspecified ear: Secondary | ICD-10-CM | POA: Insufficient documentation

## 2012-12-21 DIAGNOSIS — Z8719 Personal history of other diseases of the digestive system: Secondary | ICD-10-CM | POA: Insufficient documentation

## 2012-12-21 DIAGNOSIS — R109 Unspecified abdominal pain: Secondary | ICD-10-CM | POA: Insufficient documentation

## 2012-12-21 DIAGNOSIS — H6690 Otitis media, unspecified, unspecified ear: Secondary | ICD-10-CM | POA: Insufficient documentation

## 2012-12-21 DIAGNOSIS — R51 Headache: Secondary | ICD-10-CM | POA: Insufficient documentation

## 2012-12-21 MED ORDER — ONDANSETRON HCL 4 MG PO TABS
4.0000 mg | ORAL_TABLET | Freq: Once | ORAL | Status: AC
  Administered 2012-12-21: 4 mg via ORAL
  Filled 2012-12-21: qty 1

## 2012-12-21 MED ORDER — AMOXICILLIN-POT CLAVULANATE 875-125 MG PO TABS: 1.0000 | ORAL_TABLET | Freq: Two times a day (BID) | ORAL | Status: DC

## 2012-12-21 MED ORDER — HYDROCODONE-ACETAMINOPHEN 7.5-325 MG PO TABS: 1.0000 | ORAL_TABLET | ORAL | Status: DC | PRN

## 2012-12-21 MED ORDER — AMOXICILLIN-POT CLAVULANATE 875-125 MG PO TABS
1.0000 | ORAL_TABLET | Freq: Once | ORAL | Status: AC
  Administered 2012-12-21: 1 via ORAL
  Filled 2012-12-21: qty 1

## 2012-12-21 MED ORDER — MELOXICAM 7.5 MG PO TABS: ORAL_TABLET | ORAL | Status: DC

## 2012-12-21 MED ORDER — KETOROLAC TROMETHAMINE 10 MG PO TABS
10.0000 mg | ORAL_TABLET | Freq: Once | ORAL | Status: AC
  Administered 2012-12-21: 10 mg via ORAL
  Filled 2012-12-21: qty 1

## 2012-12-21 MED ORDER — HYDROCODONE-ACETAMINOPHEN 5-325 MG PO TABS
2.0000 | ORAL_TABLET | Freq: Once | ORAL | Status: AC
  Administered 2012-12-21: 2 via ORAL
  Filled 2012-12-21: qty 2

## 2012-12-21 NOTE — ED Provider Notes (Signed)
History     CSN: [id number]  Arrival date & time 12/21/12  1357   First MD Initiated Contact with Patient 12/21/12 1606      Chief Complaint  Patient presents with  . Otalgia    (Consider location/radiation/quality/duration/timing/severity/associated sxs/prior treatment) Patient is a 22 y.o. female presenting with ear pain. The history is provided by the patient.  Otalgia Location:  Right Behind ear: tenderness, but no redness. Quality:  Aching and throbbing Severity:  Severe Onset quality:  Gradual Timing:  Constant Progression:  Worsening Context comment:  Hx of multiple operations on the ears. Now has drainage and pain, an pain behind the right ear Relieved by:  Nothing Worsened by:  Nothing tried Ineffective treatments:  OTC medications (antibiotics) Associated symptoms: hearing loss   Associated symptoms: no abdominal pain, no cough, no fever, no neck pain, no sore throat and no vomiting   Associated symptoms comment:  Hearing loss not new. Risk factors: prior ear surgery   Risk factors: no recent travel     Past Medical History  Diagnosis Date  . Depression     panic attacks  . Anxiety   . Hearing loss   . GERD (gastroesophageal reflux disease)   . Kidney stones     Past Surgical History  Procedure Laterality Date  . Cholecystectomy    . Anoidectomy      adenoidectomy  . Tonsillectomy      No family history on file.  History  Substance Use Topics  . Smoking status: Current Every Day Smoker -- 0.50 packs/day    Types: Cigarettes  . Smokeless tobacco: Not on file  . Alcohol Use: No    OB History   Grav Para Term Preterm Abortions TAB SAB Ect Mult Living                  Review of Systems  Constitutional: Negative for fever and activity change.       All ROS Neg except as noted in HPI  HENT: Positive for hearing loss and ear pain. Negative for nosebleeds, sore throat and neck pain.   Eyes: Negative for photophobia and discharge.   Respiratory: Negative for cough, shortness of breath and wheezing.   Cardiovascular: Negative for chest pain and palpitations.  Gastrointestinal: Negative for vomiting, abdominal pain and blood in stool.  Genitourinary: Positive for flank pain. Negative for dysuria, frequency and hematuria.  Musculoskeletal: Negative for back pain and arthralgias.  Skin: Negative.   Neurological: Negative for dizziness, seizures and speech difficulty.  Psychiatric/Behavioral: Negative for hallucinations and confusion. The patient is nervous/anxious.        Depression    Allergies  Review of patient's allergies indicates no known allergies.  Home Medications   Current Outpatient Rx  Name  Route  Sig  Dispense  Refill  . acetaminophen (TYLENOL) 500 MG tablet   Oral   Take 1,000 mg by mouth every 6 (six) hours as needed. pain         . amoxicillin (AMOXIL) 500 MG capsule   Oral   Take 1 capsule (500 mg total) by mouth 3 (three) times daily.   21 capsule   0   . HYDROcodone-acetaminophen (NORCO/VICODIN) 5-325 MG per tablet   Oral   Take 1 tablet by mouth every 4 (four) hours as needed.   20 tablet   0     BP 104/73  Pulse 77  Temp(Src) 97.4 F (36.3 C) (Oral)  Resp 20  Ht 5' (1.524  m)  Wt 150 lb (68.04 kg)  BMI 29.3 kg/m2  SpO2 100%  LMP 12/06/2012  Physical Exam  Nursing note and vitals reviewed. Constitutional: She is oriented to person, place, and time. She appears well-developed and well-nourished.  Non-toxic appearance.  HENT:  Head: Normocephalic.  Right Ear: Tympanic membrane and external ear normal.  Left Ear: Tympanic membrane and external ear normal.  Mild swelling of the right EAC. Dried drainage at the opening of the right EAC. Scaring of the right TM. Pain to palpation behind the right ear. No redness or red strreaking. Oral pharynx clear.  Eyes: EOM and lids are normal. Pupils are equal, round, and reactive to light.  Neck: Normal range of motion. Neck supple.  Carotid bruit is not present.  Cardiovascular: Normal rate, regular rhythm, normal heart sounds, intact distal pulses and normal pulses.   Pulmonary/Chest: Breath sounds normal. No respiratory distress.  Abdominal: Soft. Bowel sounds are normal. There is no tenderness. There is no guarding.  Musculoskeletal: Normal range of motion.  Lymphadenopathy:       Head (right side): No submandibular adenopathy present.       Head (left side): No submandibular adenopathy present.    She has no cervical adenopathy.  Neurological: She is alert and oriented to person, place, and time. She has normal strength. No cranial nerve deficit or sensory deficit.  Skin: Skin is warm and dry.  Psychiatric: She has a normal mood and affect. Her speech is normal.    ED Course  Procedures (including critical care time)  Labs Reviewed - No data to display No results found.   No diagnosis found.    MDM  I have reviewed nursing notes, vital signs, and all appropriate lab and imaging results for this patient. Pt presents to ED with earache for 2 weeks. Vital signs stable. CT head reveal prior bilat mastoidectomies. Pt has fluid in the right mastoidectomy bed. No acute change or problem noted. Plan - Rx augmentin bid, norco q4h, mobic daily. Pt to see ENT Dr Suszanne Conners if not improving.       Kathie Dike, PA-C 12/24/12 [phone]

## 2012-12-21 NOTE — ED Notes (Signed)
Pt c/o right sided earache x 2 weeks. States she finished antbx and ear drops but is still having pain. Pt also c/o yellow drainage from ear.

## 2012-12-22 ENCOUNTER — Telehealth (HOSPITAL_COMMUNITY): Payer: Self-pay | Admitting: Emergency Medicine

## 2012-12-22 NOTE — ED Notes (Addendum)
4/21 Rcvd call from [facility] pharmacy [phone] to try to change Rx for Augmentin to something cheaper (pt doesn't have insurance).  Chart reviewed by C. Schinlever PA.  "Unable to make change in Rx due to pt's hx, recommend f/u w/ENT.  4/22 Current writer called pharmacy and let them know unable to change Augmentin Rx.

## 2012-12-24 NOTE — ED Provider Notes (Signed)
Medical screening examination/treatment/procedure(s) were performed by non-physician practitioner and as supervising physician I was immediately available for consultation/collaboration.   Benny Lennert, MD 12/24/12 1146

## 2012-12-31 ENCOUNTER — Encounter (HOSPITAL_COMMUNITY): Payer: Self-pay | Admitting: Emergency Medicine

## 2012-12-31 ENCOUNTER — Emergency Department (HOSPITAL_COMMUNITY)
Admission: EM | Admit: 2012-12-31 | Discharge: 2012-12-31 | Disposition: A | Discharge status: Home / Self Care | Payer: Self-pay | Attending: Emergency Medicine | Admitting: Emergency Medicine

## 2012-12-31 DIAGNOSIS — H9209 Otalgia, unspecified ear: Secondary | ICD-10-CM | POA: Insufficient documentation

## 2012-12-31 DIAGNOSIS — H9201 Otalgia, right ear: Secondary | ICD-10-CM

## 2012-12-31 DIAGNOSIS — Z8659 Personal history of other mental and behavioral disorders: Secondary | ICD-10-CM | POA: Insufficient documentation

## 2012-12-31 DIAGNOSIS — Z8669 Personal history of other diseases of the nervous system and sense organs: Secondary | ICD-10-CM | POA: Insufficient documentation

## 2012-12-31 DIAGNOSIS — Z8719 Personal history of other diseases of the digestive system: Secondary | ICD-10-CM | POA: Insufficient documentation

## 2012-12-31 DIAGNOSIS — F411 Generalized anxiety disorder: Secondary | ICD-10-CM | POA: Insufficient documentation

## 2012-12-31 DIAGNOSIS — Z862 Personal history of diseases of the blood and blood-forming organs and certain disorders involving the immune mechanism: Secondary | ICD-10-CM | POA: Insufficient documentation

## 2012-12-31 DIAGNOSIS — Z87442 Personal history of urinary calculi: Secondary | ICD-10-CM | POA: Insufficient documentation

## 2012-12-31 DIAGNOSIS — F17200 Nicotine dependence, unspecified, uncomplicated: Secondary | ICD-10-CM | POA: Insufficient documentation

## 2012-12-31 DIAGNOSIS — Z8639 Personal history of other endocrine, nutritional and metabolic disease: Secondary | ICD-10-CM | POA: Insufficient documentation

## 2012-12-31 MED ORDER — HYDROCODONE-ACETAMINOPHEN 5-325 MG PO TABS: 1.0000 | ORAL_TABLET | ORAL | Status: DC | PRN

## 2012-12-31 MED ORDER — HYDROCODONE-ACETAMINOPHEN 5-325 MG PO TABS
1.0000 | ORAL_TABLET | Freq: Once | ORAL | Status: AC
  Administered 2012-12-31: 1 via ORAL
  Filled 2012-12-31: qty 1

## 2012-12-31 MED ORDER — PROMETHAZINE HCL 12.5 MG PO TABS
12.5000 mg | ORAL_TABLET | Freq: Once | ORAL | Status: AC
  Administered 2012-12-31: 12.5 mg via ORAL
  Filled 2012-12-31: qty 1

## 2012-12-31 NOTE — ED Provider Notes (Signed)
History     CSN: [id number]  Arrival date & time 12/31/12  1121   First MD Initiated Contact with Patient 12/31/12 1335      Chief Complaint  Patient presents with  . Otalgia    (Consider location/radiation/quality/duration/timing/severity/associated sxs/prior treatment) HPI Comments: Patient is a 22 year old female who presents to the emergency department with complaint of right ear pain. The patient is not seen by a primary care physician at this time. The patient states that she's been having problems with her years since the age of 2. The patient was seen in the emergency department on March 31 for arthralgias. She was treated at that time with Augmentin and Norco. The patient states she took this medication and decided to go to the [facility] for another opinion. She was diagnosed with a cholesteotoma, and referred to a your specialist at the [facility]. The appointment at [facility] is not until May 12. The patient presents to the emergency department tonight to seek assistance with her pain as she was not given medication for her pain at the previous hospital.  The history is provided by the patient.    Past Medical History  Diagnosis Date  . Depression     panic attacks  . Anxiety   . Hearing loss   . GERD (gastroesophageal reflux disease)   . Kidney stones     Past Surgical History  Procedure Laterality Date  . Cholecystectomy    . Anoidectomy      adenoidectomy  . Tonsillectomy    . External ear surgery Bilateral     Family History  Problem Relation Age of Onset  . Kidney failure Brother     History  Substance Use Topics  . Smoking status: Current Every Day Smoker -- 0.50 packs/day    Types: Cigarettes  . Smokeless tobacco: Not on file  . Alcohol Use: No    OB History   Grav Para Term Preterm Abortions TAB SAB Ect Mult Living                  Review of Systems  Constitutional: Negative for activity change.       All ROS Neg  except as noted in HPI  HENT: Positive for ear pain. Negative for nosebleeds and neck pain.   Eyes: Negative for photophobia and discharge.  Respiratory: Negative for cough, shortness of breath and wheezing.   Cardiovascular: Negative for chest pain and palpitations.  Gastrointestinal: Negative for abdominal pain and blood in stool.  Genitourinary: Negative for dysuria, frequency and hematuria.  Musculoskeletal: Negative for back pain and arthralgias.  Skin: Negative.   Neurological: Negative for dizziness, seizures and speech difficulty.  Psychiatric/Behavioral: Negative for hallucinations and confusion. The patient is nervous/anxious.     Allergies  Review of patient's allergies indicates no known allergies.  Home Medications   Current Outpatient Rx  Name  Route  Sig  Dispense  Refill  . acetaminophen (TYLENOL) 500 MG tablet   Oral   Take 1,000 mg by mouth every 6 (six) hours as needed. pain         . neomycin-colistin-hydrocortisone-thonzonium (CORTISPORIN TC) 3.11-02-08-0.5 MG/ML otic suspension   Right Ear   Place 3 drops into the right ear daily.           BP 136/76  Pulse 94  Temp(Src) 98.1 F (36.7 C) (Oral)  Ht 5' (1.524 m)  Wt 150 lb (68.04 kg)  BMI 29.3 kg/m2  SpO2 100%  LMP  12/06/2012  Physical Exam  Nursing note and vitals reviewed. Constitutional: She is oriented to person, place, and time. She appears well-developed and well-nourished.  Non-toxic appearance.  HENT:  Head: Normocephalic.  Right Ear: Tympanic membrane and external ear normal.  Left Ear: Tympanic membrane and external ear normal.  There is no redness or notable swelling behind the right or left year. There is soreness when there is movement of the ear. There is some increased redness of the external auditory canal on the right. There is evidence of fluid behind the drum on the right. The left is within normal limits.  Eyes: EOM and lids are normal. Pupils are equal, round, and reactive to  light.  Neck: Normal range of motion. Neck supple. Carotid bruit is not present.  Cardiovascular: Normal rate, regular rhythm, normal heart sounds, intact distal pulses and normal pulses.   Pulmonary/Chest: Breath sounds normal. No respiratory distress.  Abdominal: Soft. Bowel sounds are normal. There is no tenderness. There is no guarding.  Musculoskeletal: Normal range of motion.  Lymphadenopathy:       Head (right side): No submandibular adenopathy present.       Head (left side): No submandibular adenopathy present.    She has no cervical adenopathy.  Neurological: She is alert and oriented to person, place, and time. She has normal strength. No cranial nerve deficit or sensory deficit.  Skin: Skin is warm and dry.  Psychiatric: She has a normal mood and affect. Her speech is normal.    ED Course  Procedures (including critical care time)  Labs Reviewed - No data to display No results found.   No diagnosis found.    MDM  I have reviewed nursing notes, vital signs, and all appropriate lab and imaging results for this patient. Patient presents to the emergency department for assistance with her ear pain. She was seen in the emergency department on March 31, and again recently at the [facility]. She has been recently diagnosed with a cholesteatoma, and is scheduled to see a specialist at the [facility] on May 12. The patient presents to the emergency department for assistance with her pain.  No acute signs of infection noted on examination today. The vital signs are well within normal limits. Prescription for Norco one every 4 hours #20 tablets given to the patient. Patient has been advised however that the emergency department cannot be utilized for chronic pain and prescription refills.       Kathie Dike, PA-C 12/31/12 1355

## 2012-12-31 NOTE — ED Provider Notes (Signed)
Medical screening examination/treatment/procedure(s) were performed by non-physician practitioner and as supervising physician I was immediately available for consultation/collaboration.  Juliet Rude. Rubin Payor, MD 12/31/12 1506

## 2012-12-31 NOTE — ED Notes (Signed)
Pt c/o ongoing ear pain  Since March 31. Has been seen here mult times and was seen at [facility]. Pt diagnosed with cholesteotoma. To be seen by specialist at [facility] 5/12.

## 2013-02-09 ENCOUNTER — Emergency Department (HOSPITAL_COMMUNITY): Payer: Self-pay

## 2013-02-09 ENCOUNTER — Emergency Department (HOSPITAL_COMMUNITY)
Admission: EM | Admit: 2013-02-09 | Discharge: 2013-02-09 | Disposition: A | Discharge status: Home / Self Care | Payer: Self-pay | Attending: Emergency Medicine | Admitting: Emergency Medicine

## 2013-02-09 ENCOUNTER — Encounter (HOSPITAL_COMMUNITY): Payer: Self-pay | Admitting: *Deleted

## 2013-02-09 DIAGNOSIS — Y9389 Activity, other specified: Secondary | ICD-10-CM | POA: Insufficient documentation

## 2013-02-09 DIAGNOSIS — Z8659 Personal history of other mental and behavioral disorders: Secondary | ICD-10-CM | POA: Insufficient documentation

## 2013-02-09 DIAGNOSIS — S300XXA Contusion of lower back and pelvis, initial encounter: Secondary | ICD-10-CM | POA: Insufficient documentation

## 2013-02-09 DIAGNOSIS — Z8669 Personal history of other diseases of the nervous system and sense organs: Secondary | ICD-10-CM | POA: Insufficient documentation

## 2013-02-09 DIAGNOSIS — Z8719 Personal history of other diseases of the digestive system: Secondary | ICD-10-CM | POA: Insufficient documentation

## 2013-02-09 DIAGNOSIS — F411 Generalized anxiety disorder: Secondary | ICD-10-CM | POA: Insufficient documentation

## 2013-02-09 DIAGNOSIS — Y929 Unspecified place or not applicable: Secondary | ICD-10-CM | POA: Insufficient documentation

## 2013-02-09 DIAGNOSIS — Z3202 Encounter for pregnancy test, result negative: Secondary | ICD-10-CM | POA: Insufficient documentation

## 2013-02-09 DIAGNOSIS — IMO0002 Reserved for concepts with insufficient information to code with codable children: Secondary | ICD-10-CM | POA: Insufficient documentation

## 2013-02-09 DIAGNOSIS — F17200 Nicotine dependence, unspecified, uncomplicated: Secondary | ICD-10-CM | POA: Insufficient documentation

## 2013-02-09 DIAGNOSIS — Z87442 Personal history of urinary calculi: Secondary | ICD-10-CM | POA: Insufficient documentation

## 2013-02-09 LAB — POCT PREGNANCY, URINE: Preg Test, Ur: NEGATIVE
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
## 2013-02-11 NOTE — ED Provider Notes (Signed)
Medical screening examination/treatment/procedure(s) were performed by non-physician practitioner and as supervising physician I was immediately available for consultation/collaboration.  Domnick Chervenak, MD 02/11/13 0106 

## 2013-10-04 ENCOUNTER — Encounter (HOSPITAL_COMMUNITY): Payer: Self-pay | Admitting: Emergency Medicine

## 2013-10-04 ENCOUNTER — Emergency Department (HOSPITAL_COMMUNITY)
Admission: EM | Admit: 2013-10-04 | Discharge: 2013-10-04 | Disposition: A | Discharge status: Home / Self Care | Payer: Medicaid Other | Attending: Emergency Medicine | Admitting: Emergency Medicine

## 2013-10-04 DIAGNOSIS — F329 Major depressive disorder, single episode, unspecified: Secondary | ICD-10-CM | POA: Insufficient documentation

## 2013-10-04 DIAGNOSIS — Z8719 Personal history of other diseases of the digestive system: Secondary | ICD-10-CM | POA: Insufficient documentation

## 2013-10-04 DIAGNOSIS — H6590 Unspecified nonsuppurative otitis media, unspecified ear: Secondary | ICD-10-CM | POA: Insufficient documentation

## 2013-10-04 DIAGNOSIS — F17200 Nicotine dependence, unspecified, uncomplicated: Secondary | ICD-10-CM | POA: Insufficient documentation

## 2013-10-04 DIAGNOSIS — F3289 Other specified depressive episodes: Secondary | ICD-10-CM | POA: Insufficient documentation

## 2013-10-04 DIAGNOSIS — F411 Generalized anxiety disorder: Secondary | ICD-10-CM | POA: Insufficient documentation

## 2013-10-04 DIAGNOSIS — Z87442 Personal history of urinary calculi: Secondary | ICD-10-CM | POA: Insufficient documentation

## 2013-10-04 MED ORDER — TRAMADOL HCL 50 MG PO TABS: 50.0000 mg | ORAL_TABLET | Freq: Four times a day (QID) | ORAL | Status: DC | PRN

## 2013-10-04 MED ORDER — AMOXICILLIN-POT CLAVULANATE 875-125 MG PO TABS
1.0000 | ORAL_TABLET | Freq: Once | ORAL | Status: AC
  Administered 2013-10-04: 1 via ORAL
  Filled 2013-10-04: qty 1

## 2013-10-04 MED ORDER — AMOXICILLIN-POT CLAVULANATE 500-125 MG PO TABS: 1.0000 | ORAL_TABLET | Freq: Three times a day (TID) | ORAL | Status: DC

## 2013-10-04 NOTE — ED Provider Notes (Signed)
CSN: [id number]     Arrival date & time 10/04/13  1128 History   First MD Initiated Contact with Patient 10/04/13 1145    This chart was scribed for Darnelle Goingiffany Llewellyn Schoenberger PA-C, a non-physician practitioner working with No att. providers found by Lewanda RifeAlexandra Hurtado, ED Scribe. This patient was seen in room TR06C/TR06C and the patient's care was started at 12:00 PM      Chief Complaint  Patient presents with  . Otalgia   (Consider location/radiation/quality/duration/timing/severity/associated sxs/prior Treatment) The history is provided by the patient. No language interpreter was used.   HPI Comments: Rachel Meadows is a [age] y.o. female who presents to the Emergency Department complaining of constant worsening right sided otalgia onset 2 days. Reports associated malodorous drainage of right ear. Reports trying tylenol, and cleaning right ear with Q-tips with no relief of symptoms. Denies any aggravating factors. Denies associated fever, and recent trauma. Reports PMHx of repeated ear tube placement and chronic ear infections.   Past Medical History  Diagnosis Date  . Depression     panic attacks  . Anxiety   . Hearing loss   . GERD (gastroesophageal reflux disease)   . Kidney stones    Past Surgical History  Procedure Laterality Date  . Cholecystectomy    . Anoidectomy      adenoidectomy  . Tonsillectomy    . External ear surgery Bilateral    Family History  Problem Relation Age of Onset  . Kidney failure Brother    History  Substance Use Topics  . Smoking status: Current Every Day Smoker -- 0.50 packs/day    Types: Cigarettes  . Smokeless tobacco: Not on file  . Alcohol Use: No   OB History   Grav Para Term Preterm Abortions TAB SAB Ect Mult Living   2 2 2       2      Review of Systems  Constitutional: Negative for fever.  HENT: Positive for ear discharge and ear pain.   Psychiatric/Behavioral: Negative for confusion.    Allergies  Review of patient's allergies indicates  no known allergies.  Home Medications   Current Outpatient Rx  Name  Route  Sig  Dispense  Refill  . acetaminophen (TYLENOL) 500 MG tablet   Oral   Take 1,000 mg by mouth every 6 (six) hours as needed. pain         . amoxicillin-clavulanate (AUGMENTIN) 500-125 MG per tablet   Oral   Take 1 tablet (500 mg total) by mouth every 8 (eight) hours.   30 tablet   0   . ibuprofen (ADVIL,MOTRIN) 200 MG tablet   Oral   Take 400 mg by mouth every 6 (six) hours as needed for pain.         . traMADol (ULTRAM) 50 MG tablet   Oral   Take 1 tablet (50 mg total) by mouth every 6 (six) hours as needed.   15 tablet   0    BP 144/81  Pulse 86  Temp(Src) 97.4 F (36.3 C) (Oral)  Resp 16  SpO2 100% Physical Exam  Nursing note and vitals reviewed. Constitutional: She is oriented to person, place, and time. She appears well-developed and well-nourished. No distress.  HENT:  Head: Normocephalic and atraumatic.  Right Ear: There is drainage. Tympanic membrane is perforated (partially ) and bulging.  Left Ear: Tympanic membrane, external ear and ear canal normal.  Right canal of ear has moderate about of discharge  No mastoid tenderness bilaterally  Eyes: EOM are normal.  Neck: Neck supple. No tracheal deviation present.  Cardiovascular: Normal rate.   Pulmonary/Chest: Effort normal. No respiratory distress.  Musculoskeletal: Normal range of motion.  Neurological: She is alert and oriented to person, place, and time.  Skin: Skin is warm and dry.  Psychiatric: She has a normal mood and affect. Her behavior is normal.    ED Course  Procedures  COORDINATION OF CARE:  Nursing notes reviewed. Vital signs reviewed. Initial pt interview and examination performed.   12:05 PM-Discussed treatment plan with pt at bedside. Pt agrees with plan.   Treatment plan initiated: Medications  amoxicillin-clavulanate (AUGMENTIN) 875-125 MG per tablet 1 tablet (1 tablet Oral Given 10/04/13 1220)      Initial diagnostic testing ordered.    Labs Review Labs Reviewed - No data to display Imaging Review No results found.  EKG Interpretation   None       MDM   1. Otitis media with effusion    Patient has frequent ear infections and is normally seen at [facility] for this. She does not have insurance and could not go back to [place]. Says that Amoxicillin sometimes work but she sometimes needs something "stronger". Second line therapy is Augmentin, will start her on course of Augmentin.  22 y.o.Rachel Meadows's evaluation in the Emergency Department is complete. It has been determined that no acute conditions requiring further emergency intervention are present at this time. The patient/guardian have been advised of the diagnosis and plan. We have discussed signs and symptoms that warrant return to the ED, such as changes or worsening in symptoms.  Vital signs are stable at discharge. Filed Vitals:   10/04/13 1133  BP: 144/81  Pulse: 86  Temp: 97.4 F (36.3 C)  Resp: 16    Patient/guardian has voiced understanding and agreed to follow-up with the PCP or specialist.  I personally performed the services described in this documentation, which was scribed in my presence. The recorded information has been reviewed and is accurate.    Dorthula Matas, PA-C 10/04/13 1230

## 2013-10-04 NOTE — ED Notes (Signed)
Has a knot in back of ear x several days and has had multiple ear issues but feels like her ear is full

## 2013-10-04 NOTE — Discharge Instructions (Signed)
Otitis Media, Adult Otitis media is redness, soreness, and swelling (inflammation) of the middle ear. Otitis media may be caused by allergies or, most commonly, by infection. Often it occurs as a complication of the common cold. SIGNS AND SYMPTOMS Symptoms of otitis media may include:  Earache.  Fever.  Ringing in your ear.  Headache.  Leakage of fluid from the ear. DIAGNOSIS To diagnose otitis media, your health care provider will examine your ear with an otoscope. This is an instrument that allows your health care provider to see into your ear in order to examine your eardrum. Your health care provider also will ask you questions about your symptoms. TREATMENT  Typically, otitis media resolves on its own within 3 5 days. Your health care provider may prescribe medicine to ease your symptoms of pain. If otitis media does not resolve within 5 days or is recurrent, your health care provider may prescribe antibiotic medicines if he or she suspects that a bacterial infection is the cause. HOME CARE INSTRUCTIONS   Take your medicine as directed until it is gone, even if you feel better after the first few days.  Only take over-the-counter or prescription medicines for pain, discomfort, or fever as directed by your health care provider.  Follow up with your health care provider as directed. SEEK MEDICAL CARE IF:  You have otitis media only in one ear or bleeding from your nose or both.  You notice a lump on your neck.  You are not getting better in 3 5 days.  You feel worse instead of better. SEEK IMMEDIATE MEDICAL CARE IF:   You have pain that is not controlled with medicine.  You have swelling, redness, or pain around your ear or stiffness in your neck.  You notice that part of your face is paralyzed.  You notice that the bone behind your ear (mastoid) is tender when you touch it. MAKE SURE YOU:   Understand these instructions.  Will watch your condition.  Will get help  right away if you are not doing well or get worse. Document Released: 05/24/2004 Document Revised: 06/09/2013 Document Reviewed: 03/16/2013 [employer] Patient Information 2014 [employer].  

## 2013-10-04 NOTE — ED Provider Notes (Signed)
Medical screening examination/treatment/procedure(s) were performed by non-physician practitioner and as supervising physician I was immediately available for consultation/collaboration.     Colbie Sliker, MD 10/04/13 1534 

## 2013-10-04 NOTE — ED Notes (Signed)
Patient states "when I was a kid, I had to have like 10 surgeries to clean out all the goop".

## 2013-11-18 ENCOUNTER — Emergency Department (HOSPITAL_COMMUNITY)
Admission: EM | Admit: 2013-11-18 | Discharge: 2013-11-18 | Disposition: A | Discharge status: Home / Self Care | Payer: Medicaid Other | Attending: Emergency Medicine | Admitting: Emergency Medicine

## 2013-11-18 ENCOUNTER — Emergency Department (HOSPITAL_COMMUNITY): Payer: Medicaid Other

## 2013-11-18 ENCOUNTER — Encounter (HOSPITAL_COMMUNITY): Payer: Self-pay | Admitting: Emergency Medicine

## 2013-11-18 DIAGNOSIS — Y939 Activity, unspecified: Secondary | ICD-10-CM | POA: Insufficient documentation

## 2013-11-18 DIAGNOSIS — F17200 Nicotine dependence, unspecified, uncomplicated: Secondary | ICD-10-CM | POA: Insufficient documentation

## 2013-11-18 DIAGNOSIS — S93601A Unspecified sprain of right foot, initial encounter: Secondary | ICD-10-CM

## 2013-11-18 DIAGNOSIS — Z8669 Personal history of other diseases of the nervous system and sense organs: Secondary | ICD-10-CM | POA: Insufficient documentation

## 2013-11-18 DIAGNOSIS — Y929 Unspecified place or not applicable: Secondary | ICD-10-CM | POA: Insufficient documentation

## 2013-11-18 DIAGNOSIS — Z8659 Personal history of other mental and behavioral disorders: Secondary | ICD-10-CM | POA: Insufficient documentation

## 2013-11-18 DIAGNOSIS — S93609A Unspecified sprain of unspecified foot, initial encounter: Secondary | ICD-10-CM | POA: Insufficient documentation

## 2013-11-18 DIAGNOSIS — Z87442 Personal history of urinary calculi: Secondary | ICD-10-CM | POA: Insufficient documentation

## 2013-11-18 DIAGNOSIS — Z8719 Personal history of other diseases of the digestive system: Secondary | ICD-10-CM | POA: Insufficient documentation

## 2013-11-18 DIAGNOSIS — W010XXA Fall on same level from slipping, tripping and stumbling without subsequent striking against object, initial encounter: Secondary | ICD-10-CM | POA: Insufficient documentation

## 2013-11-18 MED ORDER — TRAMADOL HCL 50 MG PO TABS: 50.0000 mg | ORAL_TABLET | Freq: Four times a day (QID) | ORAL | Status: DC | PRN

## 2013-11-18 MED ORDER — IBUPROFEN 600 MG PO TABS: 600.0000 mg | ORAL_TABLET | Freq: Four times a day (QID) | ORAL | Status: DC | PRN

## 2013-11-18 NOTE — ED Notes (Signed)
Pt reports she tripped over a puppy this am causing injury to her R foot.

## 2013-11-18 NOTE — Discharge Instructions (Signed)
Foot Sprain The muscles and cord like structures which attach muscle to bone (tendons) that surround the feet are made up of units. A foot sprain can occur at the weakest spot in any of these units. This condition is most often caused by injury to or overuse of the foot, as from playing contact sports, or aggravating a previous injury, or from poor conditioning, or obesity. SYMPTOMS  Pain with movement of the foot.  Tenderness and swelling at the injury site.  Loss of strength is present in moderate or severe sprains. THE THREE GRADES OR SEVERITY OF FOOT SPRAIN ARE:  Mild (Grade I): Slightly pulled muscle without tearing of muscle or tendon fibers or loss of strength.  Moderate (Grade II): Tearing of fibers in a muscle, tendon, or at the attachment to bone, with small decrease in strength.  Severe (Grade III): Rupture of the muscle-tendon-bone attachment, with separation of fibers. Severe sprain requires surgical repair. Often repeating (chronic) sprains are caused by overuse. Sudden (acute) sprains are caused by direct injury or over-use. DIAGNOSIS  Diagnosis of this condition is usually by your own observation. If problems continue, a caregiver may be required for further evaluation and treatment. X-rays may be required to make sure there are not breaks in the bones (fractures) present. Continued problems may require physical therapy for treatment. PREVENTION  Use strength and conditioning exercises appropriate for your sport.  Warm up properly prior to working out.  Use athletic shoes that are made for the sport you are participating in.  Allow adequate time for healing. Early return to activities makes repeat injury more likely, and can lead to an unstable arthritic foot that can result in prolonged disability. Mild sprains generally heal in 3 to 10 days, with moderate and severe sprains taking 2 to 10 weeks. Your caregiver can help you determine the proper time required for  healing. HOME CARE INSTRUCTIONS   Apply ice to the injury for 15-20 minutes, 03-04 times per day. Put the ice in a plastic bag and place a towel between the bag of ice and your skin.  An elastic wrap (like an Ace bandage) may be used to keep swelling down.  Keep foot above the level of the heart, or at least raised on a footstool, when swelling and pain are present.  Try to avoid use other than gentle range of motion while the foot is painful. Do not resume use until instructed by your caregiver. Then begin use gradually, not increasing use to the point of pain. If pain does develop, decrease use and continue the above measures, gradually increasing activities that do not cause discomfort, until you gradually achieve normal use.  Use crutches if and as instructed, and for the length of time instructed.  Keep injured foot and ankle wrapped between treatments.  Massage foot and ankle for comfort and to keep swelling down. Massage from the toes up towards the knee.  Only take over-the-counter or prescription medicines for pain, discomfort, or fever as directed by your caregiver. SEEK IMMEDIATE MEDICAL CARE IF:   Your pain and swelling increase, or pain is not controlled with medications.  You have loss of feeling in your foot or your foot turns cold or blue.  You develop new, unexplained symptoms, or an increase of the symptoms that brought you to your caregiver. MAKE SURE YOU:   Understand these instructions.  Will watch your condition.  Will get help right away if you are not doing well or get worse. Document Released:   02/08/2002 Document Revised: 11/11/2011 Document Reviewed: 04/07/2008 [employer] Patient Information 2014 [employer].  

## 2013-11-20 NOTE — ED Provider Notes (Signed)
Medical screening examination/treatment/procedure(s) were performed by non-physician practitioner and as supervising physician I was immediately available for consultation/collaboration.   EKG Interpretation None       Glynn OctaveStephen Lakin Romer, MD 11/20/13 1236

## 2013-11-20 NOTE — ED Provider Notes (Signed)
CSN: [id number]     Arrival date & time 11/18/13  1212 History   First MD Initiated Contact with Patient 11/18/13 1230     Chief Complaint  Patient presents with  . Foot Pain     (Consider location/radiation/quality/duration/timing/severity/associated sxs/prior Treatment) HPI Comments: Rachel Meadows is a 23 y.o. Female presenting with pain and swelling of her right foot since this am when she tripped over her puppy, causing her to land on the lateral edge of the foot.  She has constant sharp pain which is worse with weight bearing and has developed swelling of her proximal foot.  She denies radiation of pain.  She has taken ibuprofen without relief of pain.     The history is provided by the patient.    Past Medical History  Diagnosis Date  . Depression     panic attacks  . Anxiety   . Hearing loss   . GERD (gastroesophageal reflux disease)   . Kidney stones    Past Surgical History  Procedure Laterality Date  . Cholecystectomy    . Anoidectomy      adenoidectomy  . Tonsillectomy    . External ear surgery Bilateral    Family History  Problem Relation Age of Onset  . Kidney failure Brother    History  Substance Use Topics  . Smoking status: Current Every Day Smoker -- 0.50 packs/day    Types: Cigarettes  . Smokeless tobacco: Not on file  . Alcohol Use: No   OB History   Grav Para Term Preterm Abortions TAB SAB Ect Mult Living   2 2 2       2      Review of Systems  Constitutional: Negative for fever.  Musculoskeletal: Positive for arthralgias and joint swelling. Negative for myalgias.  Neurological: Negative for weakness and numbness.      Allergies  Review of patient's allergies indicates no known allergies.  Home Medications   Current Outpatient Rx  Name  Route  Sig  Dispense  Refill  . ibuprofen (ADVIL,MOTRIN) 200 MG tablet   Oral   Take 400 mg by mouth every 6 (six) hours as needed for pain.         Marland Kitchen ibuprofen (ADVIL,MOTRIN) 600 MG tablet  Oral   Take 1 tablet (600 mg total) by mouth every 6 (six) hours as needed.   30 tablet   0   . traMADol (ULTRAM) 50 MG tablet   Oral   Take 1 tablet (50 mg total) by mouth every 6 (six) hours as needed.   15 tablet   0    BP 137/86  Pulse 100  Temp(Src) 98.2 F (36.8 C) (Oral)  Resp 20  Ht 5' (1.524 m)  Wt 145 lb (65.772 kg)  BMI 28.32 kg/m2  SpO2 100%  LMP 10/12/2013 Physical Exam  Constitutional: She appears well-developed and well-nourished.  HENT:  Head: Atraumatic.  Neck: Normal range of motion.  Cardiovascular:  Pulses equal bilaterally  Musculoskeletal: She exhibits tenderness.       Right foot: She exhibits tenderness and swelling. She exhibits normal capillary refill, no crepitus and no deformity.  ttp with localized swelling over proximal 4th and 5th metatarsals.  Less than 3 sec cap refill.  Distal sensation intact.  No malleolar pain or edema.  Pt can flex and dorsiflex at the ankle.  No proximal fibular tenderness.  Dorsalis pedis pulse full.  Neurological: She is alert. She has normal strength. She displays normal reflexes. No sensory  deficit.  Skin: Skin is warm and dry.  Psychiatric: She has a normal mood and affect.    ED Course  Procedures (including critical care time) Labs Review Labs Reviewed - No data to display Imaging Review Dg Foot Complete Right  11/18/2013   CLINICAL DATA:  Foot pain status post fall  EXAM: RIGHT FOOT COMPLETE - 3+ VIEW  COMPARISON:  None.  FINDINGS: There is no evidence of fracture or dislocation. There is no evidence of arthropathy or other focal bone abnormality. Soft tissues are unremarkable.  IMPRESSION: Negative.   Electronically Signed   By: Sherian ReinWei-Chen  Lin M.D.   On: 11/18/2013 13:00     EKG Interpretation None      MDM   Final diagnoses:  Sprain of foot, right    Patients labs and/or radiological studies were viewed and considered during the medical decision making and disposition process.  Pt was given ace  wrap,  Crutches.  Advised RICE,  Ibuprofen, tramadol prescribed for pain.  Encouraged recheck in 7-10 days if not better, referral given for prn f/u.  The patient appears reasonably screened and/or stabilized for discharge and I doubt any other medical condition or other [facility] requiring further screening, evaluation, or treatment in the ED at this time prior to discharge.     Burgess AmorJulie Midge Momon, PA-C 11/20/13 [id number]

## 2013-12-17 ENCOUNTER — Other Ambulatory Visit: Payer: Self-pay | Admitting: Obstetrics and Gynecology

## 2013-12-17 DIAGNOSIS — O3680X0 Pregnancy with inconclusive fetal viability, not applicable or unspecified: Secondary | ICD-10-CM

## 2013-12-20 ENCOUNTER — Ambulatory Visit (INDEPENDENT_AMBULATORY_CARE_PROVIDER_SITE_OTHER): Payer: Medicaid Other

## 2013-12-20 DIAGNOSIS — O3680X0 Pregnancy with inconclusive fetal viability, not applicable or unspecified: Secondary | ICD-10-CM

## 2013-12-20 NOTE — Progress Notes (Signed)
U/S-single IUP with +FCA noted, FHR- 163 bpm, cx appears closed, bilateral adnexa appears wnl, CRL c/w 10+1wks EDD 07/17/2014, Sub Chorionic hemorrhage =1.6 x 0.9cm noted adjacent to GS

## 2013-12-24 ENCOUNTER — Other Ambulatory Visit: Payer: Self-pay | Admitting: Obstetrics & Gynecology

## 2013-12-24 DIAGNOSIS — Z36 Encounter for antenatal screening of mother: Secondary | ICD-10-CM

## 2013-12-27 ENCOUNTER — Other Ambulatory Visit: Payer: Medicaid Other

## 2013-12-27 ENCOUNTER — Encounter: Payer: Medicaid Other | Admitting: Women's Health

## 2014-01-03 ENCOUNTER — Ambulatory Visit (INDEPENDENT_AMBULATORY_CARE_PROVIDER_SITE_OTHER): Payer: Medicaid Other

## 2014-01-03 ENCOUNTER — Other Ambulatory Visit (HOSPITAL_COMMUNITY)
Admission: RE | Admit: 2014-01-03 | Discharge: 2014-01-03 | Disposition: A | Discharge status: Home / Self Care | Payer: Medicaid Other | Source: Ambulatory Visit | Attending: Obstetrics & Gynecology | Admitting: Obstetrics & Gynecology

## 2014-01-03 ENCOUNTER — Encounter: Payer: Self-pay | Admitting: Women's Health

## 2014-01-03 ENCOUNTER — Ambulatory Visit (INDEPENDENT_AMBULATORY_CARE_PROVIDER_SITE_OTHER): Payer: Medicaid Other | Admitting: Women's Health

## 2014-01-03 VITALS — BP 120/80 | Wt 138.0 lb

## 2014-01-03 DIAGNOSIS — Z01419 Encounter for gynecological examination (general) (routine) without abnormal findings: Secondary | ICD-10-CM | POA: Insufficient documentation

## 2014-01-03 DIAGNOSIS — Z1389 Encounter for screening for other disorder: Secondary | ICD-10-CM

## 2014-01-03 DIAGNOSIS — O99019 Anemia complicating pregnancy, unspecified trimester: Secondary | ICD-10-CM

## 2014-01-03 DIAGNOSIS — Z331 Pregnant state, incidental: Secondary | ICD-10-CM

## 2014-01-03 DIAGNOSIS — O99320 Drug use complicating pregnancy, unspecified trimester: Secondary | ICD-10-CM

## 2014-01-03 DIAGNOSIS — Z36 Encounter for antenatal screening of mother: Secondary | ICD-10-CM

## 2014-01-03 DIAGNOSIS — F1920 Other psychoactive substance dependence, uncomplicated: Secondary | ICD-10-CM

## 2014-01-03 DIAGNOSIS — F17200 Nicotine dependence, unspecified, uncomplicated: Secondary | ICD-10-CM

## 2014-01-03 DIAGNOSIS — Z113 Encounter for screening for infections with a predominantly sexual mode of transmission: Secondary | ICD-10-CM | POA: Insufficient documentation

## 2014-01-03 DIAGNOSIS — Z3480 Encounter for supervision of other normal pregnancy, unspecified trimester: Secondary | ICD-10-CM

## 2014-01-03 DIAGNOSIS — O99330 Smoking (tobacco) complicating pregnancy, unspecified trimester: Secondary | ICD-10-CM

## 2014-01-03 LAB — POCT URINALYSIS DIPSTICK
Glucose, UA: NEGATIVE
Ketones, UA: NEGATIVE
Leukocytes, UA: NEGATIVE
NITRITE UA: NEGATIVE
PROTEIN UA: NEGATIVE
RBC UA: NEGATIVE

## 2014-01-03 MED ORDER — CITRANATAL ASSURE 300 MG PO MISC: ORAL | Status: DC

## 2014-01-03 NOTE — Progress Notes (Signed)
  Subjective:  Rachel Meadows is a 23 y.o. 353P2002 Caucasian female at 8773w1d by 10.1wk u/s, was uncertain of LMP, being seen today for her first obstetrical visit.  Her obstetrical history is significant for smoker and term uncomplicated SVD x 2.  Smokes 1/2ppd, cut down from 1ppd. Interested in stopping. [place]- states she still has legal custody, but that mother 'just wanted to take children w/ her'. [place]. Pregnancy history fully reviewed.  Patient reports nausea and vomiting- daily am vomiting- declines meds. Denies vb, cramping, uti s/s, abnormal/malodorous vag d/c, or vulvovaginal itching/irritation.  BP 120/80  Wt 138 lb (62.596 kg)  LMP 10/12/2013  HISTORY: OB History  Gravida Para Term Preterm AB SAB TAB Ectopic Multiple Living  3 2 2       2     # Outcome Date GA Lbr Len/2nd Weight Sex Delivery Anes PTL Lv  3 CUR           2 TRM 11/07/10 5838w0d  7 lb (3.175 kg) F SVD EPI  Y  1 TRM 07/01/09 5797w0d  6 lb (2.722 kg) M SVD EPI  Y     Past Medical History  Diagnosis Date  . Depression     panic attacks  . Anxiety   . Hearing loss   . GERD (gastroesophageal reflux disease)   . Kidney stones    Past Surgical History  Procedure Laterality Date  . Cholecystectomy    . Anoidectomy      adenoidectomy  . Tonsillectomy    . External ear surgery Bilateral    Family History  Problem Relation Age of Onset  . Kidney failure Brother     Exam   System:     General: Well developed & nourished, no acute distress   Skin: Warm & dry, normal coloration and turgor, no rashes   Neurologic: Alert & oriented, normal mood   Cardiovascular: Regular rate & rhythm   Respiratory: Effort & rate normal, LCTAB, acyanotic   Abdomen: Soft, non tender   Extremities: normal strength, tone   Pelvic Exam:    Perineum: Normal perineum   Vulva: Normal, no lesions   Vagina:  Normal mucosa, normal discharge   Cervix:  Normal, bulbous, appears closed   Uterus: Normal size/shape/contour for GA   Thin prep pap smear obtained w/ reflex high risk HPV cotesting FHR: 171 via u/s   Assessment:   Pregnancy: [id number] Patient Active Problem List   Diagnosis Date Noted  . Supervision of other normal pregnancy 01/03/2014    Priority: High  . Smoker 01/03/2014    Priority: High    7020w6d G3P2002 New OB visit N/V pregnancy Smoker  Plan:  Initial labs drawn Rx pnv w/ 11RF per request, states her OCT pnv was making her sick Problem list reviewed and updated Reviewed n/v relief measures and warning s/s to report Reviewed recommended weight gain based on pre-gravid BMI Encouraged well-balanced diet Genetic Screening discussed Integrated Screen: 1st it/nt today Cystic fibrosis screening discussed requested Ultrasound discussed; fetal survey: requested Follow up in 4 weeks for 2nd IT and visit [facility] completed Advised smoking cessation, discussed possible risks during pregnancy w/ smoking- interested in quitting- accepted referral to [facility] so it was sent  [name] CNM, [facility]NP-BC 01/03/2014 2:31 PM

## 2014-01-03 NOTE — Progress Notes (Signed)
U/S(11+6wks)-single IUP with +FCA noted, FHR- 171 bpm, anterior Gr 0 placenta, cx appears closed, bilateral adnexa appears WNL, CRL c/w dates, NB present, NT-1.7456mm

## 2014-01-03 NOTE — Patient Instructions (Signed)
Nausea & Vomiting  Have saltine crackers or pretzels by your bed and eat a few bites before you raise your head out of bed in the morning  Eat small frequent meals throughout the day instead of large meals  Drink plenty of fluids throughout the day to stay hydrated, just don't drink a lot of fluids with your meals.  This can make your stomach fill up faster making you feel sick  Do not brush your teeth right after you eat  Products with real ginger are good for nausea, like ginger ale and ginger hard candy Make sure it says made with real ginger!  Sucking on sour candy like lemon heads is also good for nausea  If your prenatal vitamins make you nauseated, take them at night so you will sleep through the nausea  If you feel like you need medicine for the nausea & vomiting please let us know  If you are unable to keep any fluids or food down please let us know    Pregnancy - First Trimester During sexual intercourse, millions of sperm go into the vagina. Only 1 sperm will penetrate and fertilize the female egg while it is in the Fallopian tube. One week later, the fertilized egg implants into the wall of the uterus. An embryo begins to develop into a baby. At 6 to 8 weeks, the eyes and face are formed and the heartbeat can be seen on ultrasound. At the end of 12 weeks (first trimester), all the baby's organs are formed. Now that you are pregnant, you will want to do everything you can to have a healthy baby. Two of the most important things are to get good prenatal care and follow your caregiver's instructions. Prenatal care is all the medical care you receive before the baby's birth. It is given to prevent, find, and treat problems during the pregnancy and childbirth. PRENATAL EXAMS  During prenatal visits, your weight, blood pressure, and urine are checked. This is done to make sure you are healthy and progressing normally during the pregnancy.  A pregnant woman should gain 25 to 35 pounds  during the pregnancy. However, if you are overweight or underweight, your caregiver will advise you regarding your weight.  Your caregiver will ask and answer questions for you.  Blood work, cervical cultures, other necessary tests, and a Pap test are done during your prenatal exams. These tests are done to check on your health and the probable health of your baby. Tests are strongly recommended and done for HIV with your permission. This is the virus that causes AIDS. These tests are done because medicines can be given to help prevent your baby from being born with this infection should you have been infected without knowing it. Blood work is also used to find out your blood type, previous infections, and follow your blood levels (hemoglobin).  Low hemoglobin (anemia) is common during pregnancy. Iron and vitamins are given to help prevent this. Later in the pregnancy, blood tests for diabetes will be done along with any other tests if any problems develop.  You may need other tests to make sure you and the baby are doing well. CHANGES DURING THE FIRST TRIMESTER  Your body goes through many changes during pregnancy. They vary from person to person. Talk to your caregiver about changes you notice and are concerned about. Changes can include:  Your menstrual period stops.  The egg and sperm carry the genes that determine what you look like. Genes from you   and your partner are forming a baby. The female genes determine whether the baby is a boy or a girl.  Your body increases in girth and you may feel bloated.  Feeling sick to your stomach (nauseous) and throwing up (vomiting). If the vomiting is uncontrollable, call your caregiver.  Your breasts will begin to enlarge and become tender.  Your nipples may stick out more and become darker.  The need to urinate more. Painful urination may mean you have a bladder infection.  Tiring easily.  Loss of appetite.  Cravings for certain kinds of  food.  At first, you may gain or lose a couple of pounds.  You may have changes in your emotions from day to day (excited to be pregnant or concerned something may go wrong with the pregnancy and baby).  You may have more vivid and strange dreams. HOME CARE INSTRUCTIONS   It is very important to avoid all smoking, alcohol and non-prescribed drugs during your pregnancy. These affect the formation and growth of the baby. Avoid chemicals while pregnant to ensure the delivery of a healthy infant.  Start your prenatal visits by the 12th week of pregnancy. They are usually scheduled monthly at first, then more often in the last 2 months before delivery. Keep your caregiver's appointments. Follow your caregiver's instructions regarding medicine use, blood and lab tests, exercise, and diet.  During pregnancy, you are providing food for you and your baby. Eat regular, well-balanced meals. Choose foods such as meat, fish, milk and other low fat dairy products, vegetables, fruits, and whole-grain breads and cereals. Your caregiver will tell you of the ideal weight gain.  You can help morning sickness by keeping soda crackers at the bedside. Eat a couple before arising in the morning. You may want to use the crackers without salt on them.  Eating 4 to 5 small meals rather than 3 large meals a day also may help the nausea and vomiting.  Drinking liquids between meals instead of during meals also seems to help nausea and vomiting.  A physical sexual relationship may be continued throughout pregnancy if there are no other problems. Problems may be early (premature) leaking of amniotic fluid from the membranes, vaginal bleeding, or belly (abdominal) pain.  Exercise regularly if there are no restrictions. Check with your caregiver or physical therapist if you are unsure of the safety of some of your exercises. Greater weight gain will occur in the last 2 trimesters of pregnancy. Exercising will  help:  Control your weight.  Keep you in shape.  Prepare you for labor and delivery.  Help you lose your pregnancy weight after you deliver your baby.  Wear a good support or jogging bra for breast tenderness during pregnancy. This may help if worn during sleep too.  Ask when prenatal classes are available. Begin classes when they are offered.  Do not use hot tubs, steam rooms, or saunas.  Wear your seat belt when driving. This protects you and your baby if you are in an accident.  Avoid raw meat, uncooked cheese, cat litter boxes, and soil used by cats throughout the pregnancy. These carry germs that can cause birth defects in the baby.  The first trimester is a good time to visit your dentist for your dental health. Getting your teeth cleaned is okay. Use a softer toothbrush and brush gently during pregnancy.  Ask for help if you have financial, counseling, or nutritional needs during pregnancy. Your caregiver will be able to offer counseling for   these needs as well as refer you for other special needs.  Do not take any medicines or herbs unless told by your caregiver.  Inform your caregiver if there is any mental or physical domestic violence.  Make a list of emergency phone numbers of family, friends, hospital, and police and fire departments.  Write down your questions. Take them to your prenatal visit.  Do not douche.  Do not cross your legs.  If you have to stand for long periods of time, rotate you feet or take small steps in a circle.  You may have more vaginal secretions that may require a sanitary pad. Do not use tampons or scented sanitary pads. MEDICINES AND DRUG USE IN PREGNANCY  Take prenatal vitamins as directed. The vitamin should contain 1 milligram of folic acid. Keep all vitamins out of reach of children. Only a couple vitamins or tablets containing iron may be fatal to a baby or young child when ingested.  Avoid use of all medicines, including herbs,  over-the-counter medicines, not prescribed or suggested by your caregiver. Only take over-the-counter or prescription medicines for pain, discomfort, or fever as directed by your caregiver. Do not use aspirin, ibuprofen, or naproxen unless directed by your caregiver.  Let your caregiver also know about herbs you may be using.  Alcohol is related to a number of birth defects. This includes fetal alcohol syndrome. All alcohol, in any form, should be avoided completely. Smoking will cause low birth rate and premature babies.  Street or illegal drugs are very harmful to the baby. They are absolutely forbidden. A baby born to an addicted mother will be addicted at birth. The baby will go through the same withdrawal an adult does.  Let your caregiver know about any medicines that you have to take and for what reason you take them. SEEK MEDICAL CARE IF:  You have any concerns or worries during your pregnancy. It is better to call with your questions if you feel they cannot wait, rather than worry about them. SEEK IMMEDIATE MEDICAL CARE IF:   An unexplained oral temperature above 102 F (38.9 C) develops, or as your caregiver suggests.  You have leaking of fluid from the vagina (birth canal). If leaking membranes are suspected, take your temperature and inform your caregiver of this when you call.  There is vaginal spotting or bleeding. Notify your caregiver of the amount and how many pads are used.  You develop a bad smelling vaginal discharge with a change in the color.  You continue to feel sick to your stomach (nauseated) and have no relief from remedies suggested. You vomit blood or coffee ground-like materials.  You lose more than 2 pounds of weight in 1 week.  You gain more than 2 pounds of weight in 1 week and you notice swelling of your face, hands, feet, or legs.  You gain 5 pounds or more in 1 week (even if you do not have swelling of your hands, face, legs, or feet).  You get  exposed to German measles and have never had them.  You are exposed to fifth disease or chickenpox.  You develop belly (abdominal) pain. Round ligament discomfort is a common non-cancerous (benign) cause of abdominal pain in pregnancy. Your caregiver still must evaluate this.  You develop headache, fever, diarrhea, pain with urination, or shortness of breath.  You fall or are in a car accident or have any kind of trauma.  There is mental or physical violence in your home. Document   Released: 08/13/2001 Document Revised: 05/13/2012 Document Reviewed: 02/14/2009 [employer] Patient Information 2014 [employer].  

## 2014-01-04 ENCOUNTER — Encounter: Payer: Self-pay | Admitting: Women's Health

## 2014-01-04 DIAGNOSIS — F1290 Cannabis use, unspecified, uncomplicated: Secondary | ICD-10-CM | POA: Insufficient documentation

## 2014-01-04 LAB — URINALYSIS
Bilirubin Urine: NEGATIVE
Glucose, UA: NEGATIVE mg/dL
Hgb urine dipstick: NEGATIVE
KETONES UR: NEGATIVE mg/dL
Leukocytes, UA: NEGATIVE
Nitrite: NEGATIVE
PROTEIN: NEGATIVE mg/dL
Specific Gravity, Urine: 1.014 (ref 1.005–1.030)
UROBILINOGEN UA: 0.2 mg/dL (ref 0.0–1.0)
pH: 8 (ref 5.0–8.0)

## 2014-01-04 LAB — DRUG SCREEN, URINE, NO CONFIRMATION
Amphetamine Screen, Ur: NEGATIVE
BARBITURATE QUANT UR: NEGATIVE
Benzodiazepines.: NEGATIVE
COCAINE METABOLITES: NEGATIVE
Creatinine,U: 76.9 mg/dL
MARIJUANA METABOLITE: POSITIVE — AB
Methadone: NEGATIVE
OPIATE SCREEN, URINE: NEGATIVE
PHENCYCLIDINE (PCP): NEGATIVE
Propoxyphene: NEGATIVE

## 2014-01-04 LAB — VARICELLA ZOSTER ANTIBODY, IGG: Varicella IgG: 1110 Index — ABNORMAL HIGH (ref <135.00)

## 2014-01-04 LAB — RUBELLA SCREEN: Rubella: 6.28 Index — ABNORMAL HIGH (ref <0.90)

## 2014-01-04 LAB — RPR

## 2014-01-04 LAB — CBC
HEMATOCRIT: 34 % — AB (ref 36.0–46.0)
Hemoglobin: 11.5 g/dL — ABNORMAL LOW (ref 12.0–15.0)
MCH: 29.3 pg (ref 26.0–34.0)
MCHC: 33.8 g/dL (ref 30.0–36.0)
MCV: 86.5 fL (ref 78.0–100.0)
Platelets: 256 10*3/uL (ref 150–400)
RBC: 3.93 MIL/uL (ref 3.87–5.11)
RDW: 17 % — ABNORMAL HIGH (ref 11.5–15.5)
WBC: 8.1 10*3/uL (ref 4.0–10.5)

## 2014-01-04 LAB — HIV ANTIBODY (ROUTINE TESTING W REFLEX): HIV 1&2 Ab, 4th Generation: NONREACTIVE

## 2014-01-04 LAB — HEPATITIS B SURFACE ANTIGEN: HEP B S AG: NEGATIVE

## 2014-01-04 LAB — OXYCODONE SCREEN, UA, RFLX CONFIRM

## 2014-01-04 LAB — ANTIBODY SCREEN: ANTIBODY SCREEN: NEGATIVE

## 2014-01-04 LAB — ABO AND RH: Rh Type: POSITIVE

## 2014-01-05 ENCOUNTER — Encounter: Payer: Self-pay | Admitting: Women's Health

## 2014-01-05 LAB — OPIATES/OPIOIDS (LC/MS-MS)
CODEINE URINE: NEGATIVE ng/mL (ref <50)
HYDROCODONE: 74 ng/mL — AB (ref <50)
Hydromorphone: 98 ng/mL — ABNORMAL HIGH (ref <50)
MORPHINE: NEGATIVE ng/mL (ref <50)
Norhydrocodone, Ur: 182 ng/mL — ABNORMAL HIGH (ref <50)
Noroxycodone, Ur: 70 ng/mL — ABNORMAL HIGH (ref <50)
Oxycodone, ur: NEGATIVE ng/mL (ref <50)
Oxymorphone: 65 ng/mL — ABNORMAL HIGH (ref <50)

## 2014-01-05 LAB — URINE CULTURE
Colony Count: NO GROWTH
Organism ID, Bacteria: NO GROWTH

## 2014-01-05 LAB — CYSTIC FIBROSIS DIAGNOSTIC STUDY

## 2014-01-10 ENCOUNTER — Encounter: Payer: Self-pay | Admitting: Women's Health

## 2014-01-10 LAB — MATERNAL SCREEN, INTEGRATED #1

## 2014-02-01 ENCOUNTER — Encounter: Payer: Medicaid Other | Admitting: Advanced Practice Midwife

## 2014-02-09 ENCOUNTER — Encounter: Payer: Medicaid Other | Admitting: Advanced Practice Midwife

## 2014-02-16 ENCOUNTER — Encounter: Payer: Self-pay | Admitting: Advanced Practice Midwife

## 2014-02-16 ENCOUNTER — Ambulatory Visit (INDEPENDENT_AMBULATORY_CARE_PROVIDER_SITE_OTHER): Payer: Self-pay | Admitting: Advanced Practice Midwife

## 2014-02-16 VITALS — BP 120/80 | Wt 152.0 lb

## 2014-02-16 DIAGNOSIS — Z3480 Encounter for supervision of other normal pregnancy, unspecified trimester: Secondary | ICD-10-CM

## 2014-02-16 DIAGNOSIS — F1290 Cannabis use, unspecified, uncomplicated: Secondary | ICD-10-CM

## 2014-02-16 DIAGNOSIS — Z331 Pregnant state, incidental: Secondary | ICD-10-CM

## 2014-02-16 DIAGNOSIS — Z1389 Encounter for screening for other disorder: Secondary | ICD-10-CM

## 2014-02-16 LAB — POCT URINALYSIS DIPSTICK
Glucose, UA: NEGATIVE
KETONES UA: NEGATIVE
LEUKOCYTES UA: NEGATIVE
Nitrite, UA: NEGATIVE
PROTEIN UA: NEGATIVE
RBC UA: NEGATIVE

## 2014-02-16 NOTE — Progress Notes (Signed)
[id number] 6670w3d Estimated Date of Delivery: 07/17/14  Blood pressure 120/80, weight 152 lb (68.947 kg).   BP weight and urine results all reviewed and noted.  Please refer to the obstetrical flow sheet for the fundal height and fetal heart rate documentation: States quit smoking 2 weeks ago.  Denied Oxycodone and MJ until confronted with + UDS.  Counseled about not using.   Patient reports good fetal movement, denies any bleeding and no rupture of membranes symptoms or regular contractions. Patient is without complaints. All questions were answered.  Plan:  Continued routine obstetrical care,   Follow up in 2 weeks for OB appointment, anatomy scan

## 2014-02-17 LAB — DRUG SCREEN, URINE, NO CONFIRMATION
AMPHETAMINE SCRN UR: NEGATIVE
BARBITURATE QUANT UR: NEGATIVE
Benzodiazepines.: NEGATIVE
CREATININE, U: 42.5 mg/dL
Cocaine Metabolites: NEGATIVE
MARIJUANA METABOLITE: POSITIVE — AB
Methadone: NEGATIVE
OPIATE SCREEN, URINE: NEGATIVE
PHENCYCLIDINE (PCP): NEGATIVE
Propoxyphene: NEGATIVE

## 2014-02-17 LAB — OXYCODONE SCREEN, UA, RFLX CONFIRM: Oxycodone Screen, Ur: NEGATIVE ng/mL

## 2014-03-03 ENCOUNTER — Ambulatory Visit (INDEPENDENT_AMBULATORY_CARE_PROVIDER_SITE_OTHER): Payer: Medicaid Other

## 2014-03-03 ENCOUNTER — Encounter: Payer: Self-pay | Admitting: Obstetrics & Gynecology

## 2014-03-03 ENCOUNTER — Other Ambulatory Visit: Payer: Self-pay | Admitting: Advanced Practice Midwife

## 2014-03-03 ENCOUNTER — Ambulatory Visit (INDEPENDENT_AMBULATORY_CARE_PROVIDER_SITE_OTHER): Payer: Self-pay | Admitting: Obstetrics & Gynecology

## 2014-03-03 VITALS — BP 128/70 | Wt 152.0 lb

## 2014-03-03 DIAGNOSIS — O3090 Multiple gestation, unspecified, unspecified trimester: Secondary | ICD-10-CM

## 2014-03-03 DIAGNOSIS — Z3482 Encounter for supervision of other normal pregnancy, second trimester: Secondary | ICD-10-CM

## 2014-03-03 DIAGNOSIS — Z3480 Encounter for supervision of other normal pregnancy, unspecified trimester: Secondary | ICD-10-CM

## 2014-03-03 DIAGNOSIS — F1920 Other psychoactive substance dependence, uncomplicated: Secondary | ICD-10-CM

## 2014-03-03 DIAGNOSIS — Z331 Pregnant state, incidental: Secondary | ICD-10-CM

## 2014-03-03 DIAGNOSIS — Z1389 Encounter for screening for other disorder: Secondary | ICD-10-CM

## 2014-03-03 DIAGNOSIS — O355XX1 Maternal care for (suspected) damage to fetus by drugs, fetus 1: Secondary | ICD-10-CM

## 2014-03-03 DIAGNOSIS — O99320 Drug use complicating pregnancy, unspecified trimester: Secondary | ICD-10-CM

## 2014-03-03 DIAGNOSIS — O355XX0 Maternal care for (suspected) damage to fetus by drugs, not applicable or unspecified: Secondary | ICD-10-CM

## 2014-03-03 LAB — POCT URINALYSIS DIPSTICK
GLUCOSE UA: NEGATIVE
KETONES UA: NEGATIVE
Leukocytes, UA: NEGATIVE
Nitrite, UA: NEGATIVE
Protein, UA: NEGATIVE
RBC UA: NEGATIVE

## 2014-03-03 NOTE — Progress Notes (Signed)
U/S(20+4wks)-active fetus, meas c/w dates, fluid WNL, anterior gr 0 placenta, cx appears closed (3.4cm), FHR- 160 bpm, bilateral adnexa appears WNL, no major abnl noted, female fetus

## 2014-03-03 NOTE — Addendum Note (Signed)
Addended by: Criss AlvinePULLIAM, Jasean Ambrosia G on: 03/03/2014 10:40 AM   Modules accepted: Orders

## 2014-03-03 NOTE — Progress Notes (Signed)
Sonogram is reviewed and report is done. Pt denies narcotic abuse at this point.  [id number] 7977w4d Estimated Date of Delivery: 07/17/14  Blood pressure 128/70, weight 152 lb (68.947 kg).   BP weight and urine results all reviewed and noted.  Please refer to the obstetrical flow sheet for the fundal height and fetal heart rate documentation:  Patient reports good fetal movement, denies any bleeding and no rupture of membranes symptoms or regular contractions. Patient is without complaints. All questions were answered.  Plan:  Continued routine obstetrical care,   Follow up in 4 weeks for OB appointment, routine

## 2014-03-09 LAB — MATERNAL SCREEN, INTEGRATED #2
AFP MOM MAT SCREEN: 0.91
AFP, SERUM MAT SCREEN: 52.2 ng/mL
CALCULATED GESTATIONAL AGE MAT SCREEN: 20.3
CROWN RUMP LENGTH MAT SCREEN 2: 54.6 mm
ESTRIOL MOM MAT SCREEN: 0.72
Estriol, Free: 1.43 ng/mL
Inhibin A Dimeric: 396 pg/mL
Inhibin A MoM: 2.08
MSS Trisomy 18 Risk: <1:5000 {titer}
NT MoM: 1.2
NUCHAL TRANSLUCENCY MAT SCREEN 2: 1.56 mm
Number of fetuses: 1
PAPP-A MoM: 0.51
PAPP-A: 347 ng/mL
hCG MoM: 0.57
hCG, Serum: 11 IU/mL

## 2014-03-14 ENCOUNTER — Encounter: Payer: Self-pay | Admitting: Advanced Practice Midwife

## 2014-03-14 NOTE — Progress Notes (Unsigned)
Patient ID: Rachel Meadows, female   DOB: 01-11-1991, 23 y.o.   MRN: [id number] PT'S MOTHER Haywood Umanzor-MYERS CALLED (LIVES IN [place]), HAS CONCERNS FOR PT IN AN ABUSIVE SITUATION, STATES SHE IS HOMELESS.  MOTHER STATES SHE HAS CUSTODY OF HER OTHER 2 CHILDREN AND HAS CONCERNS ABOUT DRUGS.  MOTHER STATES THE PATIENT REALLY TRUSTS [facility] AND THE MOTHER STATES SHE  WOULD LIKE FOR [facility] TO ADDRESS SOME OF THESE ISSUES ON THE PATIENT'S NEXT VISIT.    Rachel Meadows

## 2014-03-31 ENCOUNTER — Encounter: Payer: Medicaid Other | Admitting: Advanced Practice Midwife

## 2014-04-07 ENCOUNTER — Encounter: Payer: Self-pay | Admitting: Advanced Practice Midwife

## 2014-04-07 ENCOUNTER — Ambulatory Visit (INDEPENDENT_AMBULATORY_CARE_PROVIDER_SITE_OTHER): Payer: Medicaid Other | Admitting: Advanced Practice Midwife

## 2014-04-07 VITALS — BP 100/76 | Wt 156.0 lb

## 2014-04-07 DIAGNOSIS — Z1389 Encounter for screening for other disorder: Secondary | ICD-10-CM

## 2014-04-07 DIAGNOSIS — O99320 Drug use complicating pregnancy, unspecified trimester: Secondary | ICD-10-CM

## 2014-04-07 DIAGNOSIS — Z3482 Encounter for supervision of other normal pregnancy, second trimester: Secondary | ICD-10-CM

## 2014-04-07 DIAGNOSIS — F1920 Other psychoactive substance dependence, uncomplicated: Secondary | ICD-10-CM

## 2014-04-07 DIAGNOSIS — Z331 Pregnant state, incidental: Secondary | ICD-10-CM

## 2014-04-07 DIAGNOSIS — Z3480 Encounter for supervision of other normal pregnancy, unspecified trimester: Secondary | ICD-10-CM

## 2014-04-07 LAB — POCT URINALYSIS DIPSTICK
GLUCOSE UA: NEGATIVE
Ketones, UA: NEGATIVE
LEUKOCYTES UA: NEGATIVE
NITRITE UA: NEGATIVE
Protein, UA: NEGATIVE
RBC UA: NEGATIVE

## 2014-04-07 NOTE — Progress Notes (Signed)
[id number] 1945w4d Estimated Date of Delivery: 07/17/14  Blood pressure 100/76, weight 156 lb (70.761 kg).   BP weight and urine results all reviewed and noted.  Please refer to the obstetrical flow sheet for the fundal height and fetal heart rate documentation:  Patient reports good fetal movement, denies any bleeding and no rupture of membranes symptoms or regular contractions. Patient is without complaints. All questions were answered.  Plan:  Continued routine obstetrical care,   Follow up in 3 weeks for OB appointment, PN2

## 2014-04-07 NOTE — Progress Notes (Signed)
Pt denies any problems or concerns at this time.  

## 2014-04-07 NOTE — Patient Instructions (Signed)
1. Before your test, do not eat or drink anything for 8-10 hours prior to your  appointment (a small amount of water is allowed and you may take any medicines you normally take). Be sure to drink lots of water the day before. 2. When you arrive, your blood will be drawn for a 'fasting' blood sugar level.  Then you will be given a sweetened carbonated beverage to drink. You should  complete drinking this beverage within five minutes. After finishing the  beverage, you will have your blood drawn exactly 1 and 2 hours later. Having  your blood drawn on time is an important part of this test. A total of three blood  samples will be done. 3. The test takes approximately 2  hours. During the test, do not have anything to  eat or drink. Do not smoke, chew gum (not even sugarless gum) or use breath mints.  4. During the test you should remain close by and seated as much as possible and  avoid walking around. You may want to bring a book or something else to  occupy your time.  5. After your test, you may eat and drink as normal. You may want to bring a snack  to eat after the test is finished. Your provider will advise you as to the results of  this test and any follow-up if necessary  You will also be retested for syphilis, HIV and blood levels (anemia):  You were already tested in the first trimester, but [facility] recommends retesting.  Additionally, you will be tested for Type 2 Herpes. MOST people do not know that they have genital herpes, as only around 15% of people have outbreaks.  However, it is still transmittable to other people, including the baby (but only during the birth).  If you test positive for Type 2 Herpes, we place you on a medicine called acyclovir the last 6 weeks of your pregnancy to prevent transmission of the virus to the baby during the birth.    If your sugar test is positive for gestational diabetes, you will be given an phone call and further instructions discussed.   We typically do not call patients with positive herpes results, but will discuss it at your next appointment.  If you wish to know all of your test results before your next appointment, feel free to call the office, or look up your test results on Mychart.  (The range that the lab uses for normal values of the sugar test are not necessarily the range that is used for pregnant women; if your results are within the range, they are definitely normal.  However, if a value is deemed "high" by the lab, it may not be too high for a pregnant woman.  We will need to discuss the normal range if your value(s) fall in the "high" category).     Sometime between 27 and 36 weeks, it is recommended that you and anyone who is going to be in close contact with your baby receive the Tdap booster.  You should receive it EACH pregnancy, regardless of when your last booster was.  You may go to the Health Department (no appointment necessary) or your Primary Care office to receive the vaccine.  If you do not receive the vaccine prior to delivery, it will be offered in the hospital.  However, if you get it at least 2 weeks prior to delivery, you will have the added advantage of passing the immunity to your baby.   

## 2014-04-08 LAB — DRUG SCREEN, URINE, NO CONFIRMATION
Amphetamine Screen, Ur: NEGATIVE
BENZODIAZEPINES.: NEGATIVE
Barbiturate Quant, Ur: NEGATIVE
CREATININE, U: 95.8 mg/dL
Cocaine Metabolites: NEGATIVE
METHADONE: NEGATIVE
Marijuana Metabolite: POSITIVE — AB
Opiate Screen, Urine: NEGATIVE
PHENCYCLIDINE (PCP): NEGATIVE
PROPOXYPHENE: NEGATIVE

## 2014-04-08 LAB — OXYCODONE SCREEN, UA, RFLX CONFIRM: OXYCODONE SCRN UR: NEGATIVE ng/mL

## 2014-04-26 ENCOUNTER — Encounter: Payer: Medicaid Other | Admitting: Advanced Practice Midwife

## 2014-04-26 ENCOUNTER — Other Ambulatory Visit: Payer: Medicaid Other

## 2014-04-29 ENCOUNTER — Inpatient Hospital Stay (HOSPITAL_COMMUNITY): Payer: Medicaid Other

## 2014-04-29 ENCOUNTER — Encounter (HOSPITAL_COMMUNITY): Payer: Self-pay | Admitting: Emergency Medicine

## 2014-04-29 ENCOUNTER — Inpatient Hospital Stay (HOSPITAL_COMMUNITY)
Admission: EM | Admit: 2014-04-29 | Discharge: 2014-05-23 | DRG: 765 | Disposition: A | Discharge status: Home / Self Care | Payer: Medicaid Other | Attending: Obstetrics & Gynecology | Admitting: Obstetrics & Gynecology

## 2014-04-29 DIAGNOSIS — O322XX0 Maternal care for transverse and oblique lie, not applicable or unspecified: Secondary | ICD-10-CM | POA: Diagnosis present

## 2014-04-29 DIAGNOSIS — O42913 Preterm premature rupture of membranes, unspecified as to length of time between rupture and onset of labor, third trimester: Secondary | ICD-10-CM | POA: Diagnosis present

## 2014-04-29 DIAGNOSIS — Z3689 Encounter for other specified antenatal screening: Secondary | ICD-10-CM

## 2014-04-29 DIAGNOSIS — H9190 Unspecified hearing loss, unspecified ear: Secondary | ICD-10-CM | POA: Diagnosis present

## 2014-04-29 DIAGNOSIS — O411090 Infection of amniotic sac and membranes, unspecified, unspecified trimester, not applicable or unspecified: Secondary | ICD-10-CM | POA: Diagnosis present

## 2014-04-29 DIAGNOSIS — O99323 Drug use complicating pregnancy, third trimester: Secondary | ICD-10-CM

## 2014-04-29 DIAGNOSIS — O9902 Anemia complicating childbirth: Secondary | ICD-10-CM | POA: Diagnosis present

## 2014-04-29 DIAGNOSIS — O368190 Decreased fetal movements, unspecified trimester, not applicable or unspecified: Secondary | ICD-10-CM | POA: Diagnosis present

## 2014-04-29 DIAGNOSIS — O99320 Drug use complicating pregnancy, unspecified trimester: Secondary | ICD-10-CM

## 2014-04-29 DIAGNOSIS — F1210 Cannabis abuse, uncomplicated: Secondary | ICD-10-CM | POA: Diagnosis present

## 2014-04-29 DIAGNOSIS — D649 Anemia, unspecified: Secondary | ICD-10-CM | POA: Diagnosis present

## 2014-04-29 DIAGNOSIS — O99334 Smoking (tobacco) complicating childbirth: Secondary | ICD-10-CM | POA: Diagnosis present

## 2014-04-29 DIAGNOSIS — K219 Gastro-esophageal reflux disease without esophagitis: Secondary | ICD-10-CM | POA: Diagnosis present

## 2014-04-29 DIAGNOSIS — F341 Dysthymic disorder: Secondary | ICD-10-CM | POA: Diagnosis present

## 2014-04-29 DIAGNOSIS — Z87442 Personal history of urinary calculi: Secondary | ICD-10-CM | POA: Diagnosis not present

## 2014-04-29 DIAGNOSIS — O98812 Other maternal infectious and parasitic diseases complicating pregnancy, second trimester: Secondary | ICD-10-CM

## 2014-04-29 DIAGNOSIS — O4290 Premature rupture of membranes, unspecified as to length of time between rupture and onset of labor, unspecified weeks of gestation: Secondary | ICD-10-CM | POA: Diagnosis present

## 2014-04-29 DIAGNOSIS — O321XX0 Maternal care for breech presentation, not applicable or unspecified: Secondary | ICD-10-CM | POA: Diagnosis present

## 2014-04-29 DIAGNOSIS — O99344 Other mental disorders complicating childbirth: Secondary | ICD-10-CM | POA: Diagnosis present

## 2014-04-29 DIAGNOSIS — O42919 Preterm premature rupture of membranes, unspecified as to length of time between rupture and onset of labor, unspecified trimester: Secondary | ICD-10-CM

## 2014-04-29 DIAGNOSIS — A749 Chlamydial infection, unspecified: Secondary | ICD-10-CM | POA: Diagnosis present

## 2014-04-29 DIAGNOSIS — Z3483 Encounter for supervision of other normal pregnancy, third trimester: Secondary | ICD-10-CM

## 2014-04-29 DIAGNOSIS — F1920 Other psychoactive substance dependence, uncomplicated: Secondary | ICD-10-CM

## 2014-04-29 HISTORY — DX: Encounter for supervision of normal pregnancy, unspecified, unspecified trimester: Z34.90

## 2014-04-29 LAB — WET PREP, GENITAL
Clue Cells Wet Prep HPF POC: NONE SEEN
TRICH WET PREP: NONE SEEN
WBC, Wet Prep HPF POC: NONE SEEN
Yeast Wet Prep HPF POC: NONE SEEN

## 2014-04-29 LAB — URINALYSIS, ROUTINE W REFLEX MICROSCOPIC
Bilirubin Urine: NEGATIVE
Glucose, UA: NEGATIVE mg/dL
Ketones, ur: NEGATIVE mg/dL
Leukocytes, UA: NEGATIVE
NITRITE: NEGATIVE
PROTEIN: NEGATIVE mg/dL
Specific Gravity, Urine: 1.02 (ref 1.005–1.030)
UROBILINOGEN UA: 0.2 mg/dL (ref 0.0–1.0)
pH: 5.5 (ref 5.0–8.0)

## 2014-04-29 LAB — URINE MICROSCOPIC-ADD ON

## 2014-04-29 LAB — AMNISURE RUPTURE OF MEMBRANE (ROM) NOT AT ARMC: AMNISURE: POSITIVE

## 2014-04-29 LAB — RAPID URINE DRUG SCREEN, HOSP PERFORMED
Amphetamines: NOT DETECTED
BARBITURATES: NOT DETECTED
Benzodiazepines: NOT DETECTED
Cocaine: NOT DETECTED
Opiates: NOT DETECTED
Tetrahydrocannabinol: NOT DETECTED

## 2014-04-29 LAB — CBC WITH DIFFERENTIAL/PLATELET
BASOS ABS: 0 10*3/uL (ref 0.0–0.1)
Basophils Relative: 0 % (ref 0–1)
EOS PCT: 2 % (ref 0–5)
Eosinophils Absolute: 0.3 10*3/uL (ref 0.0–0.7)
HCT: 28.7 % — ABNORMAL LOW (ref 36.0–46.0)
Hemoglobin: 10 g/dL — ABNORMAL LOW (ref 12.0–15.0)
LYMPHS ABS: 2.1 10*3/uL (ref 0.7–4.0)
Lymphocytes Relative: 20 % (ref 12–46)
MCH: 31.5 pg (ref 26.0–34.0)
MCHC: 34.8 g/dL (ref 30.0–36.0)
MCV: 90.5 fL (ref 78.0–100.0)
Monocytes Absolute: 0.6 10*3/uL (ref 0.1–1.0)
Monocytes Relative: 6 % (ref 3–12)
NEUTROS PCT: 72 % (ref 43–77)
Neutro Abs: 7.7 10*3/uL (ref 1.7–7.7)
PLATELETS: 226 10*3/uL (ref 150–400)
RBC: 3.17 MIL/uL — AB (ref 3.87–5.11)
RDW: 13.6 % (ref 11.5–15.5)
WBC: 10.7 10*3/uL — ABNORMAL HIGH (ref 4.0–10.5)

## 2014-04-29 LAB — HIV ANTIBODY (ROUTINE TESTING W REFLEX): HIV 1&2 Ab, 4th Generation: NONREACTIVE

## 2014-04-29 MED ORDER — SODIUM CHLORIDE 0.9 % IV SOLN
2.0000 g | Freq: Four times a day (QID) | INTRAVENOUS | Status: AC
  Administered 2014-04-29 – 2014-05-01 (×8): 2 g via INTRAVENOUS
  Filled 2014-04-29 (×8): qty 2000

## 2014-04-29 MED ORDER — DOCUSATE SODIUM 100 MG PO CAPS
100.0000 mg | ORAL_CAPSULE | Freq: Every day | ORAL | Status: DC
  Administered 2014-04-29 – 2014-05-21 (×23): 100 mg via ORAL
  Filled 2014-04-29 (×25): qty 1

## 2014-04-29 MED ORDER — MAGNESIUM SULFATE 40 G IN LACTATED RINGERS - SIMPLE
2.0000 g/h | INTRAVENOUS | Status: AC
  Administered 2014-05-01: 2 g/h via INTRAVENOUS
  Filled 2014-04-29 (×3): qty 500

## 2014-04-29 MED ORDER — ZOLPIDEM TARTRATE 5 MG PO TABS: 5.0000 mg | ORAL_TABLET | Freq: Every evening | ORAL | Status: DC | PRN

## 2014-04-29 MED ORDER — PRENATAL MULTIVITAMIN CH
1.0000 | ORAL_TABLET | Freq: Every day | ORAL | Status: DC
  Administered 2014-04-29 – 2014-05-21 (×21): 1 via ORAL
  Filled 2014-04-29 (×22): qty 1

## 2014-04-29 MED ORDER — CALCIUM CARBONATE ANTACID 500 MG PO CHEW: 2.0000 | CHEWABLE_TABLET | ORAL | Status: DC | PRN

## 2014-04-29 MED ORDER — AMOXICILLIN 500 MG PO CAPS
500.0000 mg | ORAL_CAPSULE | Freq: Three times a day (TID) | ORAL | Status: AC
  Administered 2014-05-01 – 2014-05-05 (×15): 500 mg via ORAL
  Filled 2014-04-29 (×15): qty 1

## 2014-04-29 MED ORDER — ACETAMINOPHEN 325 MG PO TABS
650.0000 mg | ORAL_TABLET | ORAL | Status: DC | PRN
  Administered 2014-05-14: 650 mg via ORAL
  Filled 2014-04-29: qty 2

## 2014-04-29 MED ORDER — LACTATED RINGERS IV SOLN
INTRAVENOUS | Status: DC
  Administered 2014-04-29 – 2014-04-30 (×4): via INTRAVENOUS

## 2014-04-29 MED ORDER — AZITHROMYCIN 250 MG PO TABS
1000.0000 mg | ORAL_TABLET | Freq: Once | ORAL | Status: AC
  Administered 2014-04-29: 1000 mg via ORAL
  Filled 2014-04-29: qty 4

## 2014-04-29 MED ORDER — MAGNESIUM SULFATE BOLUS VIA INFUSION
4.0000 g | Freq: Once | INTRAVENOUS | Status: AC
  Administered 2014-04-29: 4 g via INTRAVENOUS
  Filled 2014-04-29: qty 500

## 2014-04-29 MED ORDER — BETAMETHASONE SOD PHOS & ACET 6 (3-3) MG/ML IJ SUSP
12.0000 mg | INTRAMUSCULAR | Status: AC
  Administered 2014-04-29 – 2014-04-30 (×2): 12 mg via INTRAMUSCULAR
  Filled 2014-04-29 (×2): qty 2

## 2014-04-29 NOTE — Progress Notes (Signed)
UR completed 

## 2014-04-29 NOTE — H&P (Signed)
Rachel Meadows is a 23 y.o. G3P2002 at [redacted]w[redacted]d admitted for PPROM around midnight tonight. She presented to [facility] with c/o leaking fluid, ruled PPROM with SSE/nitrazine +.  No digital exam performed.  Patient Active Problem List   Diagnosis Date Noted  . Preterm premature rupture of membranes (PPROM) with unknown onset of labor 04/29/2014  . Marijuana use 01/04/2014  . Supervision of other normal pregnancy 01/03/2014  . Smoker 01/03/2014   Past Medical History: Past Medical History  Diagnosis Date  . Depression     panic attacks  . Anxiety   . Hearing loss   . GERD (gastroesophageal reflux disease)   . Kidney stones   . Pregnant     Past Surgical History: Past Surgical History  Procedure Laterality Date  . Cholecystectomy    . Anoidectomy      adenoidectomy  . Tonsillectomy    . External ear surgery Bilateral     Obstetrical History: OB History   Grav Para Term Preterm Abortions TAB SAB Ect Mult Living   Social History: History   Social History  . Marital Status: Single    Spouse Name: N/A    Number of Children: N/A  . Years of Education: N/A   Social History Main Topics  . Smoking status: Current Every Day Smoker -- 0.50 packs/day    Types: Cigarettes  . Smokeless tobacco: Never Used  . Alcohol Use: No  . Drug Use: No  . Sexual Activity: Yes    Birth Control/ Protection: None   Other Topics Concern  . None   Social History Narrative  . None    Family History: Family History  Problem Relation Age of Onset  . Kidney failure Brother     Allergies: No Known Allergies  Prescriptions prior to admission  Medication Sig Dispense Refill  . Prenatal Vit-Fe Fumarate-FA (PRENATAL VITAMIN PO) Take by mouth daily.      . Prenat w/o A-FeCbGl-DSS-FA-DHA (CITRANATAL ASSURE) 300 MG MISC One tablet and one capsule daily  30 each  11     Review of Systems   Constitutional: Negative for fever and chills Eyes: Negative for visual  disturbances Respiratory: Negative for shortness of breath, dyspnea Cardiovascular: Negative for chest pain or palpitations  Gastrointestinal: Negative for vomiting, diarrhea and constipation Genitourinary: Negative for dysuria and urgency Musculoskeletal: Negative for back pain, joint pain, myalgias  Neurological: Negative for dizziness and headaches     Vitals:  Blood pressure 107/60, pulse 87, temperature 98 F (36.7 C), temperature source Oral, resp. rate 20, height 5' (1.524 m), weight 72.576 kg (160 lb), SpO2 98.00%. Physical Examination: Per Jeani Hawking MD:  General: Well-developed, well-nourished female in no acute distress; appearance consistent with age of record  HENT: normocephalic; atraumatic  Eyes: pupils equal, round and reactive to light; extraocular muscles intact  Neck: supple  Heart: regular rate and rhythm  Lungs: clear to auscultation bilaterally  Abdomen: soft; gravid, consistent with dates; nontender; bowel sounds present  GU: Normal external genitalia; fluid in vagina, pH 8-9; cervix closed; no vaginal bleeding seen  Extremities: No deformity; full range of motion; pulses normal  Pelvic:  Deferred.  Leaking clear fluid. + amnisure.  AFI 9cms Neurologic: Awake, alert and oriented; motor function intact in all extremities and symmetric; no facial droop  Skin: Warm and dry  Psychiatric: Normal mood and affect    FHR:150, avg LTV, accesl+,  no decels Contractions: none    Labs:  Results for orders placed during the hospital encounter of 04/29/14 (from the past 24 hour(s))  CBC WITH DIFFERENTIAL   Collection Time    04/29/14  2:24 AM      Result Value Ref Range   WBC 10.7 (*) 4.0 - 10.5 K/uL   RBC 3.17 (*) 3.87 - 5.11 MIL/uL   Hemoglobin 10.0 (*) 12.0 - 15.0 g/dL   HCT 16.1 (*) 09.6 - 04.5 %   MCV 90.5  78.0 - 100.0 fL   MCH 31.5  26.0 - 34.0 pg   MCHC 34.8  30.0 - 36.0 g/dL   RDW 40.9  81.1 - 91.4 %   Platelets 226  150 - 400 K/uL   Neutrophils  Relative % 72  43 - 77 %   Neutro Abs 7.7  1.7 - 7.7 K/uL   Lymphocytes Relative 20  12 - 46 %   Lymphs Abs 2.1  0.7 - 4.0 K/uL   Monocytes Relative 6  3 - 12 %   Monocytes Absolute 0.6  0.1 - 1.0 K/uL   Eosinophils Relative 2  0 - 5 %   Eosinophils Absolute 0.3  0.0 - 0.7 K/uL   Basophils Relative 0  0 - 1 %   Basophils Absolute 0.0  0.0 - 0.1 K/uL  AMNISURE RUPTURE OF MEMBRANE (ROM)   Collection Time    04/29/14  3:30 AM      Result Value Ref Range   Amnisure ROM POSITIVE      Imaging Studies: AFI 9.7, anterior placenta, fetas TRANSVERSE W HEAD TO MATERNAL RIGHT  . ampicillin (OMNIPEN) IV  2 g Intravenous Q6H   Followed by  . [START ON 05/01/2014] amoxicillin  500 mg Oral Q8H  . betamethasone acetate-betamethasone sodium phosphate  12 mg Intramuscular Q24H  . docusate sodium  100 mg Oral Daily  . magnesium  4 g Intravenous Once  . prenatal multivitamin  1 tablet Oral Q1200      ASSESSMENT: PPROM at 28.5 weeks Transverse lie with head to maternal right   PLAN:   Admit BMZ MgS04 during BMZ Antibiotics per PPROM protocol Screen for infection  NICU consult

## 2014-04-29 NOTE — ED Provider Notes (Signed)
CSN: [id number]     Arrival date & time 04/29/14  0125 History   First MD Initiated Contact with Patient 04/29/14 0139     Chief Complaint  Patient presents with  . Leaking Fluid      (Consider location/radiation/quality/duration/timing/severity/associated sxs/prior Treatment) HPI This is a 23 year old who is about [redacted] weeks pregnant with her first child. Her pregnancy had been going well until about midnight when she urinated. This was followed by a gush of fluid from her vagina. She had a subsequent episode about 30 minutes later. She quantifies the fluid as "enough to make me change my pants". She noted 2 small blood clots in the toilet but is otherwise not noticed any vaginal bleeding. She is not having any cramping or abdominal pain. There no specific exacerbating or mitigating factors.  Past Medical History  Diagnosis Date  . Depression     panic attacks  . Anxiety   . Hearing loss   . GERD (gastroesophageal reflux disease)   . Kidney stones   . Pregnant    Past Surgical History  Procedure Laterality Date  . Cholecystectomy    . Anoidectomy      adenoidectomy  . Tonsillectomy    . External ear surgery Bilateral    Family History  Problem Relation Age of Onset  . Kidney failure Brother    History  Substance Use Topics  . Smoking status: Current Every Day Smoker -- 0.50 packs/day    Types: Cigarettes  . Smokeless tobacco: Never Used  . Alcohol Use: No   OB History   Grav Para Term Preterm Abortions TAB SAB Ect Mult Living   Review of Systems  All other systems reviewed and are negative.   Allergies  Review of patient's allergies indicates no known allergies.  Home Medications   Prior to Admission medications   Medication Sig Start Date End Date Taking? Authorizing Provider  Prenatal Vit-Fe Fumarate-FA (PRENATAL VITAMIN PO) Take by mouth daily.   Yes Historical Provider, MD  Prenat w/o A-FeCbGl-DSS-FA-DHA (CITRANATAL ASSURE) 300 MG MISC  One tablet and one capsule daily 01/03/14   Marge Duncans, CNM   BP 132/74  Pulse 106  Temp(Src) 98 F (36.7 C) (Oral)  Resp 20  Ht 5' (1.524 m)  Wt 160 lb (72.576 kg)  BMI 31.25 kg/m2  SpO2 98% Physical Exam General: Well-developed, well-nourished female in no acute distress; appearance consistent with age of record HENT: normocephalic; atraumatic Eyes: pupils equal, round and reactive to light; extraocular muscles intact Neck: supple Heart: regular rate and rhythm Lungs: clear to auscultation bilaterally Abdomen: soft; gravid, consistent with dates; nontender; bowel sounds present GU: Normal external genitalia; fluid in vagina, pH 8-9; cervix closed; no vaginal bleeding seen Extremities: No deformity; full range of motion; pulses normal Neurologic: Awake, alert and oriented; motor function intact in all extremities and symmetric; no facial droop Skin: Warm and dry Psychiatric: Normal mood and affect    ED Course  Procedures (including critical care time)  MDM  2:07 AM Will transfer patient to [facility] in May you after discussion with Doctors Ferguson and Anyanwu.    Hanley Seamen, MD 04/29/14 [phone]

## 2014-04-29 NOTE — ED Notes (Signed)
Pt states she had a gush of fluid after she urinated, denies pain or other complaints.  Pt states the fluid was clear.

## 2014-04-29 NOTE — Progress Notes (Signed)
Pt presents with complaints of fluid leaking after she urinates. Placed on the fetal monitor by AP [facility]. RN states pt is not complaining of any pain

## 2014-04-29 NOTE — H&P (Signed)
Attestation of Attending Supervision of Advanced Practitioner (PA/CNM/NP): Evaluation and management procedures were performed by the Advanced Practitioner under my supervision and collaboration.  I have reviewed the Advanced Practitioner's note and chart, and I agree with the management and plan.  Growth ultrasound ordered. Will continue latency antibiotics (Ampicillin and Azithromycin) and betamethasone regimen, .magnesium sulfate for now.  Continue close inpatient monitoring.  Jaynie Collins, MD, FACOG Attending Obstetrician & Gynecologist Faculty Practice, [facility]

## 2014-04-30 DIAGNOSIS — A749 Chlamydial infection, unspecified: Secondary | ICD-10-CM | POA: Diagnosis present

## 2014-04-30 DIAGNOSIS — O98812 Other maternal infectious and parasitic diseases complicating pregnancy, second trimester: Secondary | ICD-10-CM

## 2014-04-30 LAB — GC/CHLAMYDIA PROBE AMP
CT Probe RNA: POSITIVE — AB
GC PROBE AMP APTIMA: NEGATIVE

## 2014-04-30 MED ORDER — AZITHROMYCIN 250 MG PO TABS: 1000.0000 mg | ORAL_TABLET | Freq: Once | ORAL | Status: DC

## 2014-04-30 NOTE — Progress Notes (Signed)
Patient ID: Rachel Meadows, female   DOB: Feb 15, 1991, 23 y.o.   MRN: [id number] ACULTY PRACTICE ANTEPARTUM COMPREHENSIVE PROGRESS NOTE  Rachel Meadows is a 23 y.o. G3P2002 at [redacted]w[redacted]d  who is admitted for PROM.   Fetal presentation is transverse. Length of Stay:  1  Days  Subjective: Pt with no complaints. Patient reports good fetal movement.  She reports no uterine contractions, no bleeding and no loss of fluid per vagina.  Vitals:  Blood pressure 110/47, pulse 94, temperature 97.5 F (36.4 C), temperature source Oral, resp. rate 18, height 5' (1.524 m), weight 160 lb (72.576 kg), SpO2 100.00%. Physical Examination: General appearance - alert, well appearing, and in no distress Abdomen - soft, nontender, nondistended, no masses or organomegaly gravid Extremities - peripheral pulses normal, no pedal edema, no clubbing or cyanosis Cervical Exam: Not evaluated.  Membranes:ruptured  Fetal Monitoring:  Baseline: 130's bpm, Variability: Good {> 6 bpm) and Accelerations: Reactive  Labs:  Results for orders placed during the hospital encounter of 04/29/14 (from the past 24 hour(s))  URINALYSIS, ROUTINE W REFLEX MICROSCOPIC   Collection Time    04/29/14  3:00 PM      Result Value Ref Range   Color, Urine YELLOW  YELLOW   APPearance CLEAR  CLEAR   Specific Gravity, Urine 1.020  1.005 - 1.030   pH 5.5  5.0 - 8.0   Glucose, UA NEGATIVE  NEGATIVE mg/dL   Hgb urine dipstick TRACE (*) NEGATIVE   Bilirubin Urine NEGATIVE  NEGATIVE   Ketones, ur NEGATIVE  NEGATIVE mg/dL   Protein, ur NEGATIVE  NEGATIVE mg/dL   Urobilinogen, UA 0.2  0.0 - 1.0 mg/dL   Nitrite NEGATIVE  NEGATIVE   Leukocytes, UA NEGATIVE  NEGATIVE  URINE MICROSCOPIC-ADD ON   Collection Time    04/29/14  3:00 PM      Result Value Ref Range   Squamous Epithelial / LPF MANY (*) RARE   WBC, UA 0-2  <3 WBC/hpf   RBC / HPF 3-6  <3 RBC/hpf    Imaging Studies:    None today  Medications:  Scheduled . ampicillin (OMNIPEN) IV  2 g  Intravenous Q6H   Followed by  . [START ON 05/01/2014] amoxicillin  500 mg Oral Q8H  . docusate sodium  100 mg Oral Daily  . prenatal multivitamin  1 tablet Oral Q1200   I have reviewed the patient's current medications.  ASSESSMENT: Patient Active Problem List   Diagnosis Date Noted  . Preterm premature rupture of membranes in third trimester 04/29/2014  . Marijuana use 01/04/2014  . Supervision of other normal pregnancy 01/03/2014  . Smoker 01/03/2014    PLAN: Continue antibiotics for latency watch for s/sx of fetal or maternal distress Pt s/p BMZ and MgSulfate Continue routine antenatal care.   HARRAWAY-SMITH, Aissata Wilmore 04/30/2014,8:47 AM

## 2014-05-01 DIAGNOSIS — O4290 Premature rupture of membranes, unspecified as to length of time between rupture and onset of labor, unspecified weeks of gestation: Secondary | ICD-10-CM

## 2014-05-01 LAB — CULTURE, BETA STREP (GROUP B ONLY)

## 2014-05-01 NOTE — Consult Note (Signed)
Neonatology Consult to Antenatal Patient:  I was asked by Dr. Macon Large to see this patient in order to provide antenatal counseling due to premature ROM at 29 weeks.  Ms. Rachel Meadows was admitted 8/28 and is now at 28 0/[redacted] weeks GA. She had SROM about 60 hours ago. She is currently not having active labor. She is getting IV Ampicillin and Magnesium sulfate, and got 2 doses of Betamethasone on 8/28-29. The baby is a girl and is this patient's third child. She has no previous experience with premature infants.  I spoke with the patient and the father of the baby. We discussed the worst case of delivery in the next 1-2 days, including usual DR management, possible respiratory complications and need for support, IV access, feedings (mother desires breast feeding, which was encouraged), LOS, Mortality and Morbidity, and long term outcomes. She did not have any questions at this time. I offered a NICU tour to any interested family members and would be glad to come back if she has more questions later.  Thank you for asking me to see this patient.  Doretha Sou, MD Neonatologist  The total length of face-to-face or floor/unit time for this encounter was 25 minutes. Counseling and/or coordination of care was 15 minutes of the above.

## 2014-05-01 NOTE — Progress Notes (Signed)
Patient ID: Rachel Meadows, female   DOB: September 11, 1990, 23 y.o.   MRN: [id number] GRAZIA TAFFE is a 23 y.o. G3P2002 at [redacted] weeks EGA who is admitted for PROM.  Fetal presentation is transverse.  Length of Stay: 2 Days  Subjective:  Pt with no complaints.  Patient reports good fetal movement. She reports no uterine contractions, no bleeding and no loss of fluid per vagina.  Vitals: Blood pressure 110/47, pulse 94, temperature 97.5 F (36.4 C), temperature source Oral, resp. rate 18, height 5' (1.524 m), weight 160 lb (72.576 kg), SpO2 100.00%.  Physical Examination:  General appearance - alert, well appearing, and in no distress  Abdomen - soft, nontender, nondistended, no masses or organomegaly  gravid  Extremities - peripheral pulses normal, no pedal edema, no clubbing or cyanosis  Cervical Exam: Not evaluated.  Membranes:ruptured  Fetal Monitoring: Baseline: 130's bpm, Variability: Good {> 6 bpm) and Accelerations: Reactive  Labs:  Results for orders placed during the hospital encounter of 04/29/14 (from the past 24 hour(s))   URINALYSIS, ROUTINE W REFLEX MICROSCOPIC    Collection Time    04/29/14 3:00 PM   Result  Value  Ref Range    Color, Urine  YELLOW  YELLOW    APPearance  CLEAR  CLEAR    Specific Gravity, Urine  1.020  1.005 - 1.030    pH  5.5  5.0 - 8.0    Glucose, UA  NEGATIVE  NEGATIVE mg/dL    Hgb urine dipstick  TRACE (*)  NEGATIVE    Bilirubin Urine  NEGATIVE  NEGATIVE    Ketones, ur  NEGATIVE  NEGATIVE mg/dL    Protein, ur  NEGATIVE  NEGATIVE mg/dL    Urobilinogen, UA  0.2  0.0 - 1.0 mg/dL    Nitrite  NEGATIVE  NEGATIVE    Leukocytes, UA  NEGATIVE  NEGATIVE   URINE MICROSCOPIC-ADD ON    Collection Time    04/29/14 3:00 PM   Result  Value  Ref Range    Squamous Epithelial / LPF  MANY (*)  RARE    WBC, UA  0-2  <3 WBC/hpf    RBC / HPF  3-6  <3 RBC/hpf   Imaging Studies:  None today  Medications: Scheduled  .  ampicillin (OMNIPEN) IV  2 g  Intravenous  Q6H    Followed by   .  [START ON 05/01/2014] amoxicillin  500 mg  Oral  Q8H   .  docusate sodium  100 mg  Oral  Daily   .  prenatal multivitamin  1 tablet  Oral  Q1200   I have reviewed the patient's current medications.  ASSESSMENT:  Patient Active Problem List    Diagnosis  Date Noted   .  Preterm premature rupture of membranes in third trimester  04/29/2014   .  Marijuana use  01/04/2014   .  Supervision of other normal pregnancy  01/03/2014   .  Smoker  01/03/2014   PLAN:  Continue antibiotics for latency  watch for s/sx of fetal or maternal distress  Pt s/p BMZ and MgSulfate  Continue routine antenatal care.

## 2014-05-01 NOTE — Progress Notes (Signed)
Acknowledged order for CSW consult.   Informed that FOB does not want family to be notified that they are in the hospital.  Parents also reportedly have transportation issues.  Met with both parents. They were pleasant and receptive to Harris visit.  Mother requested that FOB remain in the room during the interview.   Parents state that they are living together at the [facility] in [place].  FOB Vida Roller state that he recently began working at the hotel where they are living.  Mother has two other dependents ages 9 and 65.  Informed that both children are currently with her mother in [place].  Mother states that they have been staying in [place] with her mother for the past 5 months.  Informed that she does not have a hx of DSS involvement.  Per chart review, patient has hx of anxiety and depression.  Patient was also positive for marijuana on 01/03/14 and 02/16/14.  UDS was negative 04/29/14.  Mother denied any hx of substance abuse or mental illness.  Did not push the issue while FOB was present.  Will need to address mother's hx of mental illness and substance abuse when she is alone.  FOB states that family is aware of mother being hospitalized.  Informed that they did not want to inform family when mother was initially hospitalized because they did not know what was going on at the time and wanted to find out more information before speaking with family.  FOB also state that transportation is not a major issue.  Mother reportedly has a car that has not be working for about two weeks.  FOB states plans to repair the car once he gets paid.  He also states that the people in his community has been very supportive and willing to assist them with transportation.

## 2014-05-01 NOTE — Progress Notes (Signed)
Reviewed care plan with patient. Discussed signs of infection. Patient verbalized an understanding and provided teach back.

## 2014-05-02 MED ORDER — SODIUM CHLORIDE 0.9 % IJ SOLN
3.0000 mL | Freq: Two times a day (BID) | INTRAMUSCULAR | Status: DC
  Administered 2014-05-02 – 2014-05-12 (×20): 3 mL via INTRAVENOUS

## 2014-05-02 NOTE — Progress Notes (Signed)
Ur chart review completed.  

## 2014-05-02 NOTE — Progress Notes (Signed)
Patient ID: Rachel Meadows, female   DOB: July 10, 1991, 23 y.o.   MRN: [id number] FACULTY PRACTICE ANTEPARTUM(COMPREHENSIVE) NOTE  Rachel Meadows is a 23 y.o. G3P2002 at [redacted]w[redacted]d by early ultrasound who is admitted for rupture of membranes.   Fetal presentation is breech.[place] 8/28 Length of Stay:  3  Days  Subjective: Pink stained fluid Patient reports the fetal movement as active. Patient reports uterine contraction  activity as none. Patient reports  vaginal bleeding as scant staining. Patient describes fluid per vagina as Clear.  Vitals:  Blood pressure 120/58, pulse 93, temperature 98.5 F (36.9 C), temperature source Oral, resp. rate 18, height 5' (1.524 m), weight 160 lb (72.576 kg), SpO2 98.00%. Physical Examination:  General appearance - alert, well appearing, and in no distress Heart - normal rate and regular rhythm Abdomen - soft, nontender, nondistended Fundal Height:  size equals dates Cervical Exam: Not evaluated.  Extremities: extremities normal, atraumatic, no cyanosis or edema and Homans sign is negative, no sign of DVT  Membranes:ruptured, clear fluid  Fetal Monitoring:  Baseline: 145 bpm, Variability: Good {> 6 bpm), Accelerations: Reactive and Decelerations: Absent  Labs:  No results found for this or any previous visit (from the past 24 hour(s)).  Imaging Studies:     Currently EPIC will not allow sonographic studies to automatically populate into notes.  In the meantime, copy and paste results into note or free text.  Medications:  Scheduled . amoxicillin  500 mg Oral Q8H  . docusate sodium  100 mg Oral Daily  . prenatal multivitamin  1 tablet Oral Q1200  received azithromycin I have reviewed the patient's current medications.  ASSESSMENT: Patient Active Problem List   Diagnosis Date Noted  . Chlamydia infection affecting pregnancy in second trimester, antepartum 04/30/2014  . Preterm premature rupture of membranes in third trimester 04/29/2014  . Marijuana use  01/04/2014  . Supervision of other normal pregnancy 01/03/2014  . Smoker 01/03/2014    PLAN: Amoxicillin , monitor for s/sx PTL, infection, delivery for maternal or fetal indications  ARNOLD,JAMES 05/02/2014,7:40 AM

## 2014-05-03 DIAGNOSIS — F1910 Other psychoactive substance abuse, uncomplicated: Secondary | ICD-10-CM

## 2014-05-03 DIAGNOSIS — O99340 Other mental disorders complicating pregnancy, unspecified trimester: Secondary | ICD-10-CM

## 2014-05-03 DIAGNOSIS — O4290 Premature rupture of membranes, unspecified as to length of time between rupture and onset of labor, unspecified weeks of gestation: Secondary | ICD-10-CM

## 2014-05-03 NOTE — Progress Notes (Signed)
Pt leaving in wheelchair with FOB at side. Pt states she know that MD doesn't want her to leave floor. Pt aware of risks while leaving floor. MD informed. Will continue to monitor.

## 2014-05-03 NOTE — Progress Notes (Signed)
Pt. Is using a wheelchair that FOB found dow the hall to "get some fresh air." MD informed and stated pt. Is not to go off the unit. I instructed the pt. Not to leave and she said she has been told that, but is still going to go. We discussed the risks. Will continue to monitor.

## 2014-05-03 NOTE — Progress Notes (Signed)
Patient ID: Rachel Meadows, female   DOB: 1990/11/29, 23 y.o.   MRN: [id number] FACULTY PRACTICE ANTEPARTUM(COMPREHENSIVE) NOTE  KENITA BINES is a 23 y.o. G3P2002 at [redacted]w[redacted]d by early ultrasound who is admitted for rupture of membranes.   Fetal presentation is breech.[place] 8/28 Length of Stay:  4  Days  Subjective: Patient reports intermittent leakage of fluid Patient reports the fetal movement as active. Patient reports uterine contraction  activity as none. Patient reports  vaginal bleeding as scant staining. Patient describes fluid per vagina as Clear.  Vitals:  Blood pressure 122/66, pulse 92, temperature 98.2 F (36.8 C), temperature source Oral, resp. rate 20, height 5' (1.524 m), weight 160 lb (72.576 kg), SpO2 98.00%. Physical Examination:  General appearance - alert, well appearing, and in no distress Heart - normal rate and regular rhythm Abdomen - soft, nontender, gravid Fundal Height:  size equals dates Cervical Exam: Not evaluated.  Extremities: extremities normal, atraumatic, no cyanosis or edema and Homans sign is negative, no sign of DVT  Membranes:ruptured, clear fluid  Fetal Monitoring:  Baseline: 150 bpm, Variability: Good {> 6 bpm), Accelerations: Reactive and Decelerations: Absent Toco: no contractions  Labs:  No results found for this or any previous visit (from the past 24 hour(s)).  Imaging Studies:     Currently EPIC will not allow sonographic studies to automatically populate into notes.  In the meantime, copy and paste results into note or free text.  Medications:  Scheduled . amoxicillin  500 mg Oral Q8H  . docusate sodium  100 mg Oral Daily  . prenatal multivitamin  1 tablet Oral Q1200  . sodium chloride  3 mL Intravenous Q12H  received azithromycin I have reviewed the patient's current medications.  ASSESSMENT: Patient Active Problem List   Diagnosis Date Noted  . Chlamydia infection affecting pregnancy in second trimester, antepartum 04/30/2014  .  Preterm premature rupture of membranes in third trimester 04/29/2014  . Marijuana use 01/04/2014  . Supervision of other normal pregnancy 01/03/2014  . Smoker 01/03/2014    PLAN: Continue latency antibiotics Continue monitoring for si/sx of chorioamnionitis Continue current antepartum care   Starnisha Batrez 05/03/2014,6:53 AM

## 2014-05-04 LAB — CBC WITH DIFFERENTIAL/PLATELET
BASOS PCT: 0 % (ref 0–1)
Basophils Absolute: 0 10*3/uL (ref 0.0–0.1)
EOS PCT: 2 % (ref 0–5)
Eosinophils Absolute: 0.2 10*3/uL (ref 0.0–0.7)
HEMATOCRIT: 31.1 % — AB (ref 36.0–46.0)
Hemoglobin: 10.6 g/dL — ABNORMAL LOW (ref 12.0–15.0)
Lymphocytes Relative: 15 % (ref 12–46)
Lymphs Abs: 2.2 10*3/uL (ref 0.7–4.0)
MCH: 31.8 pg (ref 26.0–34.0)
MCHC: 34.1 g/dL (ref 30.0–36.0)
MCV: 93.4 fL (ref 78.0–100.0)
MONO ABS: 0.8 10*3/uL (ref 0.1–1.0)
MONOS PCT: 5 % (ref 3–12)
Neutro Abs: 12 10*3/uL — ABNORMAL HIGH (ref 1.7–7.7)
Neutrophils Relative %: 78 % — ABNORMAL HIGH (ref 43–77)
Platelets: 255 10*3/uL (ref 150–400)
RBC: 3.33 MIL/uL — ABNORMAL LOW (ref 3.87–5.11)
RDW: 14 % (ref 11.5–15.5)
WBC: 15.2 10*3/uL — ABNORMAL HIGH (ref 4.0–10.5)

## 2014-05-04 MED ORDER — AZITHROMYCIN 250 MG PO TABS
500.0000 mg | ORAL_TABLET | Freq: Every day | ORAL | Status: AC
  Administered 2014-05-04 – 2014-05-05 (×2): 500 mg via ORAL
  Filled 2014-05-04 (×2): qty 2

## 2014-05-04 NOTE — Progress Notes (Addendum)
Patient ID: Rachel Meadows, female   DOB: August 11, 1991, 23 y.o.   MRN: [id number] ACULTY PRACTICE ANTEPARTUM COMPREHENSIVE PROGRESS NOTE  Rachel Meadows is a 23 y.o. G3P2002 at [redacted]w[redacted]d  who is admitted for PROM.   Fetal presentation is breech. Length of Stay:  5  Days  Subjective:  Patient reports good fetal movement.  She reports no uterine contractions, no bleeding but, still c/o loss of fluid per vagina.  Vitals:  Blood pressure 107/51, pulse 85, temperature 98.7 F (37.1 C), temperature source Oral, resp. rate 18, height 5' (1.524 m), weight 160 lb (72.576 kg), SpO2 98.00%. Physical Examination: General appearance - alert, well appearing, and in no distress Abdomen - soft, nontender, nondistended, no masses or organomegaly Pelvic - examination not indicated Extremities - peripheral pulses normal, no pedal edema, no clubbing or cyanosis Cervical Exam: Not evaluated.  Membranes:ruptured  Fetal Monitoring:  Baseline: 150's bpm, Variability: Good {> 6 bpm), Accelerations: Reactive and Decelerations: Absent  Labs:  No results found for this or any previous visit (from the past 24 hour(s)).  Imaging Studies:    Fetus in complete breech presentation   Medications:  Scheduled . amoxicillin  500 mg Oral Q8H  . docusate sodium  100 mg Oral Daily  . prenatal multivitamin  1 tablet Oral Q1200  . sodium chloride  3 mL Intravenous Q12H   I have reviewed the patient's current medications.  ASSESSMENT: Patient Active Problem List   Diagnosis Date Noted  . Chlamydia infection affecting pregnancy in second trimester, antepartum 04/30/2014  . Preterm premature rupture of membranes in third trimester 04/29/2014  . Marijuana use 01/04/2014  . Supervision of other normal pregnancy 01/03/2014  . Smoker 01/03/2014    PLAN: Reviewed pts medication she received the IV Azithromycin but, did not get the 3 days po Order adjusted for latency deliver for maternal or fetal indications Continue routine  antenatal care. Pt counseled not to leave hosp and about the potential risks.  She reports that she will not leave the hosp or the unit without permission.   HARRAWAY-SMITH, Naythen Heikkila 05/04/2014,7:33 AM

## 2014-05-05 ENCOUNTER — Other Ambulatory Visit: Payer: Medicaid Other

## 2014-05-05 ENCOUNTER — Encounter: Payer: Medicaid Other | Admitting: Advanced Practice Midwife

## 2014-05-05 NOTE — Progress Notes (Signed)
UR completed 

## 2014-05-05 NOTE — Progress Notes (Signed)
Patient ID: Rachel Meadows, female   DOB: Jul 24, 1991, 23 y.o.   MRN: [id number] FACULTY PRACTICE ANTEPARTUM COMPREHENSIVE PROGRESS NOTE  Rachel Meadows is a 23 y.o. G3P2002 at [redacted]w[redacted]d  who is admitted for PROM.  Fetal presentation is breech.  Length of Stay: 6 Days  Subjective:  Patient reports good fetal movement. She reports no uterine contractions, no bleeding but, still c/o loss of fluid per vagina.  Vitals: Blood pressure 107/51, pulse 85, temperature 98.7 F (37.1 C), temperature source Oral, resp. rate 18, height 5' (1.524 m), weight 160 lb (72.576 kg), SpO2 98.00%.  Physical Examination:  General appearance - alert, well appearing, and in no distress  Abdomen - soft, nontender, nondistended, no masses or organomegaly  Pelvic - examination not indicated  Extremities - peripheral pulses normal, no pedal edema, no clubbing or cyanosis  Cervical Exam: Not evaluated.  Membranes:ruptured  Fetal Monitoring: Baseline: 150's bpm, Variability: Good {> 6 bpm), Accelerations: Reactive and Decelerations: Absent  Labs:  No results found for this or any previous visit (from the past 24 hour(s)).  Imaging Studies:  Fetus in complete breech presentation  Medications: Scheduled  .  amoxicillin  500 mg  Oral  Q8H   .  docusate sodium  100 mg  Oral  Daily   .  prenatal multivitamin  1 tablet  Oral  Q1200   .  sodium chloride  3 mL  Intravenous  Q12H   I have reviewed the patient's current medications.  ASSESSMENT:  Patient Active Problem List    Diagnosis  Date Noted   .  Chlamydia infection affecting pregnancy in second trimester, antepartum  04/30/2014   .  Preterm premature rupture of membranes in third trimester  04/29/2014   .  Marijuana use  01/04/2014   .  Supervision of other normal pregnancy  01/03/2014   .  Smoker  01/03/2014   PLAN:  Reviewed pts medication she received the IV Azithromycin but, did not get the 3 days po Order adjusted for latency  deliver for maternal or fetal  indications  Continue routine antenatal care.  Pt counseled not to leave hosp and about the potential risks. She reports that she will not leave the hosp or the unit without permission.   Lazaro Arms 05/05/2014

## 2014-05-05 NOTE — Progress Notes (Signed)
Late Entry: CSW met with patient to introduce myself, as she initially met with weekend CSW, and offer support.  Patient was quiet, but pleasant and welcoming.  She was receptive to CSW's visit.  She reports maintaining positivity throughout her hospitalization and states she reminds herself that being here is what is best for her baby.  CSW commended her for her positive attitude and encouraged her to continue this way of thinking.  CSW explained that CSW is here to talk to if she feels she needs to process her feelings related to her current situation or hospitalization at any time.  She seemed appreciative.  CSW briefly reviewed what she spoke to weekend CSW about regarding her social situation.  She reports she and FOB are in a committed relationship and that he is supportive.  She states she has two children, ages 34 and 41, who live with her mother in [place].  She reports these are not FOB's children.  CSW asked MOB about the arrangement as to why the children do not live here with her.  She replied, "it's a long story."  CSW invited her to elaborate if she felt comfortable.  She was guarded, but spoke some about the arrangement.  CSW is unclear about the details after speaking with MOB.  She states they were all living together here and that her stepfather's job took the family to [facility].  She decided to stay and send the children with her mother and stepfather.  She reports her parents have more resources to care for the children than she does.  She states she is hopeful that her stepfather's job will be bringing him back to the area very soon and then they will all be together again.  CSW asked how much contact she has with her children and she reports talking with them daily and "flying up there twice a month to visit."  CSW asked about baby's FOB and if he has been here with her in the hospital.  She reports he had to work yesterday and today at the [facility], which is also where the couple resides.  CSW asked  how FOB gets back and forth to the hospital and she states FOB has a truck from his father.  CSW feels there is much more to her story, and is hopeful that details may come with subsequent visits.  CSW provided contact information and asked that patient call if she would like to speak with CSW at any time.  CSW will check in with patient throughout hospitalization.

## 2014-05-05 NOTE — Plan of Care (Signed)
Problem: Consults Goal: Birthing Suites Patient Information Press F2 to bring up selections list  Outcome: Not Applicable Date Met:  83/47/58 Patient is admitted to antenatal     Problem: Phase I Progression Outcomes Goal: Spiritual Needs (Notify SW/CM or Social Work) Outcome: Progressing Social work met with patient on 05/04/14.

## 2014-05-06 NOTE — Progress Notes (Signed)
Patient ID: MEAGEN LIMONES, female   DOB: 02-05-1991, 23 y.o.   MRN: [id number]  FACULTY PRACTICE ANTEPARTUM NOTE  DASHAE WILCHER is a 23 y.o. G3P2002 at [redacted]w[redacted]d  who is admitted for PPROM.   Fetal presentation is breech. Length of Stay:  7  Days  Subjective: Patient reports some mild decreased fetal movement.  She reports no uterine contractions, no bleeding.  Continues to have intermittent leaking fluid per vagina.  Vitals:  Blood pressure 106/58, pulse 79, temperature 98.1 F (36.7 C), temperature source Oral, resp. rate 20, height 5' (1.524 m), weight 72.258 kg (159 lb 4.8 oz), SpO2 98.00%. Physical Examination:  General appearance - alert, well appearing, and in no distress Abdomen - soft, nontender, nondistended, no masses or organomegaly Baby palpated transverse. Fundal Height:  size equals dates Extremities: extremities normal, atraumatic, no cyanosis or edema and Homans sign is negative, no sign of DVT with DTRs 2+ bilaterally Membranes:ruptured  Fetal Monitoring:  Baseline: 150s bpm, Variability: Good {> 6 bpm), Accelerations: Reactive and no decels  Labs:  No results found for this or any previous visit (from the past 24 hour(s)).  Imaging Studies:     Medications:  Scheduled . docusate sodium  100 mg Oral Daily  . prenatal multivitamin  1 tablet Oral Q1200  . sodium chloride  3 mL Intravenous Q12H   I have reviewed the patient's current medications.  ASSESSMENT: Patient Active Problem List   Diagnosis Date Noted  . Chlamydia infection affecting pregnancy in second trimester, antepartum 04/30/2014  . Preterm premature rupture of membranes in third trimester 04/29/2014  . Marijuana use 01/04/2014  . Supervision of other normal pregnancy 01/03/2014  . Smoker 01/03/2014    PLAN: Completed Latency antibiotics.   Category 1 tracing.  Continue NST BID Continue routine antenatal care.   Tashina Credit JEHIEL 05/06/2014,8:47 AM

## 2014-05-07 LAB — GLUCOSE TOLERANCE, 1 HOUR: Glucose, 1 Hour GTT: 92 mg/dL (ref 70–140)

## 2014-05-07 NOTE — Progress Notes (Signed)
FACULTY PRACTICE ANTEPARTUM NOTE  Rachel Meadows is a 23 y.o. G3P2002 at [redacted]w[redacted]d  who is admitted for PPROM.   Fetal presentation is breech. Length of Stay:  8  Days  Subjective:  Patient reports good fetal movement.  She reports no uterine contractions, has been leaking fluid.  Reported vaginal bleeding to nurse, has since improved.  Vitals:  Blood pressure 101/51, pulse 70, temperature 97.8 F (36.6 C), temperature source Oral, resp. rate 18, height 5' (1.524 m), weight 159 lb 4.8 oz (72.258 kg), SpO2 98.00%. Physical Examination:  General appearance - alert, well appearing, and in no distress Abdomen - soft, nondistended, no masses or organomegaly, gravid with mild tenderness to palpation Extremities - peripheral pulses normal, no pedal edema, no clubbing or cyanosis Skin - normal coloration and turgor, no rashes, no suspicious skin lesions noted Pelvic Exam:  normal external genitalia, vulva, vagina, cervix, uterus and adnexa Cervical Exam: Not evaluated. Extremities: extremities normal, atraumatic, no cyanosis or edema  Fetal Monitoring:  Baseline: 140 bpm, Variability: Good {> 6 bpm), Accelerations: Reactive and Decelerations: Absent  Labs:  No results found for this or any previous visit (from the past 24 hour(s)).  Imaging Studies:    none   Medications:  Scheduled . docusate sodium  100 mg Oral Daily  . prenatal multivitamin  1 tablet Oral Q1200  . sodium chloride  3 mL Intravenous Q12H   I have reviewed the patient's current medications.  ASSESSMENT: Patient Active Problem List   Diagnosis Date Noted  . Chlamydia infection affecting pregnancy in second trimester, antepartum 04/30/2014  . Preterm premature rupture of membranes in third trimester 04/29/2014  . Marijuana use 01/04/2014  . Supervision of other normal pregnancy 01/03/2014  . Smoker 01/03/2014    PLAN: S/p latency antibiotics VB: pad inspected, appears to be blood tinged amniotic fluid, no pressure at  this time NST BID Continue routine antenatal care.   Thetis Schwimmer ROCIO 05/07/2014,7:46 AM

## 2014-05-07 NOTE — Progress Notes (Signed)
S/s of infection and ptl discussed with patient. Patient verbalized an understanding and was able to repeat information.

## 2014-05-08 NOTE — Progress Notes (Signed)
Patient ID: Rachel Meadows, female   DOB: 03/08/1991, 23 y.o.   MRN: [id number] FACULTY PRACTICE ANTEPARTUM(COMPREHENSIVE) NOTE  XIAO GRAUL is a 23 y.o. G3P2002 at [redacted]w[redacted]d by best clinical estimate who is admitted for PPROM.   Fetal presentation is breech. Length of Stay:  9  Days  Subjective: Doing well--no complaints.  Patient reports the fetal movement as active. Patient reports uterine contraction  activity as none. Patient reports  vaginal bleeding as none. Patient describes fluid per vagina as Clear.  Vitals:  Blood pressure 119/65, pulse 106, temperature 98 F (36.7 C), temperature source Oral, resp. rate 18, height 5' (1.524 m), weight 159 lb 4.8 oz (72.258 kg), SpO2 98.00%. Physical Examination:  General appearance - alert, well appearing, and in no distress Abdomen - gravid, NT Fundal Height:  size equals dates Extremities: extremities normal, atraumatic, no cyanosis or edema  Membranes:ruptured, clear fluid  Fetal Monitoring:  Baseline: 150 bpm, Variability: Good {> 6 bpm), Accelerations: Reactive and Decelerations: Variable: mild  Labs:  Results for orders placed during the hospital encounter of 04/29/14 (from the past 24 hour(s))  GLUCOSE TOLERANCE, 1 HOUR   Collection Time    05/07/14  9:50 AM      Result Value Ref Range   Glucose, 1 Hour GTT 92  70 - 140 mg/dL     Medications:  Scheduled . docusate sodium  100 mg Oral Daily  . prenatal multivitamin  1 tablet Oral Q1200  . sodium chloride  3 mL Intravenous Q12H   I have reviewed the patient's current medications.  ASSESSMENT: Patient Active Problem List   Diagnosis Date Noted  . Chlamydia infection affecting pregnancy in second trimester, antepartum 04/30/2014  . Preterm premature rupture of membranes in third trimester 04/29/2014  . Marijuana use 01/04/2014  . Supervision of other normal pregnancy 01/03/2014  . Smoker 01/03/2014    PLAN: Continue inpatient care for PPROM. Delivery with s/sx's of  infection.  Reva Bores, MD 05/08/2014,7:11 AM

## 2014-05-09 ENCOUNTER — Inpatient Hospital Stay (HOSPITAL_COMMUNITY): Payer: Medicaid Other

## 2014-05-09 DIAGNOSIS — F1210 Cannabis abuse, uncomplicated: Secondary | ICD-10-CM

## 2014-05-09 DIAGNOSIS — O98319 Other infections with a predominantly sexual mode of transmission complicating pregnancy, unspecified trimester: Secondary | ICD-10-CM

## 2014-05-09 DIAGNOSIS — O99330 Smoking (tobacco) complicating pregnancy, unspecified trimester: Secondary | ICD-10-CM

## 2014-05-09 DIAGNOSIS — A749 Chlamydial infection, unspecified: Secondary | ICD-10-CM

## 2014-05-09 NOTE — Progress Notes (Signed)
Patient ID: Rachel Meadows, female   DOB: [birth year], 23 y.o.   MRN: [id number] FACULTY PRACTICE ANTEPARTUM(COMPREHENSIVE) NOTE  Rachel Meadows is a 23 y.o. G3P2002 at [redacted]w[redacted]d  who is admitted for rupture of membranes.   Length of Stay:  10  Days  Subjective: Patient reports the fetal movement as active. Patient reports uterine contraction  activity as none. Patient reports  vaginal bleeding as none. Patient describes fluid per vagina as Other pink.  Vitals:  Blood pressure 112/59, pulse 87, temperature 98 F (36.7 C), temperature source Oral, resp. rate 18, height 5' (1.524 m), weight 159 lb 4.8 oz (72.258 kg), SpO2 98.00%. Physical Examination:  General appearance - alert, well appearing, and in no distress Abdomen - gravid, soft, non tender Extremities - no edema, redness or tenderness in the calves or thighs, Homan's sign negative bilaterally  Fetal Monitoring:  Baseline: 140 bpm, Variability: Good {> 6 bpm), Accelerations: Reactive and Decelerations: Absent  Labs:  No results found for this or any previous visit (from the past 24 hour(s)).  Imaging Studies:    Needs BPP today  Medications:  Scheduled . docusate sodium  100 mg Oral Daily  . prenatal multivitamin  1 tablet Oral Q1200  . sodium chloride  3 mL Intravenous Q12H   I have reviewed the patient's current medications.  ASSESSMENT: Patient Active Problem List   Diagnosis Date Noted  . Chlamydia infection affecting pregnancy in second trimester, antepartum 04/30/2014  . Preterm premature rupture of membranes in third trimester 04/29/2014  . Marijuana use 01/04/2014  . Supervision of other normal pregnancy 01/03/2014  . Smoker 01/03/2014    PLAN: Stable PPROM Will get BPP today and check position. No signs of chorioamnionitis   Saylor Murry H. 05/09/2014,6:13 AM

## 2014-05-09 NOTE — Progress Notes (Signed)
Ur chart review completed.  

## 2014-05-09 NOTE — Progress Notes (Signed)
Antenatal Nutrition Assessment:  Currently  30 1/[redacted] weeks gestation, with PROM. Height  60 "  Weight 159 lbs  pre-pregnancy weight 139 lbs .  Pre-pregnancy  BMI 27.1  IBW 100 lbs Total weight gain 20.lbs Weight gain goals 25-35 lbs Estimated needs: 1600-1800 kcal/day, 55-65 grams protein/day, 1.8 liters fluid/day  Regular diet  Current diet prescription will provide for increased needs.  No abnormal nutrition related labs.  1 hour GTT wnl  Nutrition Dx: Increased nutrient needs r/t pregnancy and fetal growth requirements aeb [redacted] weeks gestation.  No educational needs assessed at this time.  Elisabeth Cara M.Odis Luster LDN Neonatal Nutrition Support Specialist/RD III Pager [phone]

## 2014-05-10 ENCOUNTER — Encounter: Payer: Self-pay | Admitting: Obstetrics & Gynecology

## 2014-05-10 NOTE — Progress Notes (Signed)
Pt left floor via wheelchair with significant other.

## 2014-05-10 NOTE — Progress Notes (Signed)
Patient ID: Rachel Meadows, female   DOB: 11-15-1990, 23 y.o.   MRN: [id number] FACULTY PRACTICE ANTEPARTUM(COMPREHENSIVE) NOTE  Rachel Meadows is a 23 y.o. G3P2002 at [redacted]w[redacted]d by best clinical estimate who is admitted for PPROM.   Fetal presentation is cephalic. Length of Stay:  11  Days  Subjective: Doing well Patient reports the fetal movement as active. Patient reports uterine contraction  activity as none. Patient reports  vaginal bleeding as scant staining. Patient describes fluid per vagina as Other pink.  Vitals:  Blood pressure 106/47, pulse 87, temperature 98.6 F (37 C), temperature source Oral, resp. rate 18, height 5' (1.524 m), weight 159 lb 4.8 oz (72.258 kg), SpO2 98.00%. Physical Examination:  General appearance - alert, well appearing, and in no distress Abdomen - gravid, minimal tenderness Fundal Height:  size equals dates Extremities: extremities normal, atraumatic, no cyanosis or edema  Membranes:ruptured, clear fluid  Fetal Monitoring:  Baseline: 150 bpm, Variability: Good {> 6 bpm), Accelerations: Reactive and Decelerations: Absent   Medications:  Scheduled . docusate sodium  100 mg Oral Daily  . prenatal multivitamin  1 tablet Oral Q1200  . sodium chloride  3 mL Intravenous Q12H   I have reviewed the patient's current medications.  ASSESSMENT: Patient Active Problem List   Diagnosis Date Noted  . Chlamydia infection affecting pregnancy in second trimester, antepartum 04/30/2014  . Preterm premature rupture of membranes in third trimester 04/29/2014  . Marijuana use 01/04/2014  . Supervision of other normal pregnancy 01/03/2014  . Smoker 01/03/2014    PLAN: Doing well. Vertex now. Delivery with s/sx's chorio.  Discussed at length with patient.  Rachel Bores, MD 05/10/2014,7:30 AM

## 2014-05-10 NOTE — Progress Notes (Signed)
CSW attempted to meet with patient to check in and offer support.  She was resting at this time.  CSW will attempt again at a later time.

## 2014-05-10 NOTE — Plan of Care (Signed)
Problem: Phase I Progression Outcomes Goal: LOS < 4 days Outcome: Not Met (add Reason) Not met due to prolonged hospitalization     

## 2014-05-11 NOTE — Progress Notes (Signed)
FOB sleeping in the bed with pt asked pt if she had enough room to rest comfortibly

## 2014-05-11 NOTE — Progress Notes (Signed)
At 1930 05/10/14, I was called by security guard, Ree Kida, to come to room #152.  She explained the support person and FOB of Ms Tischer was asked to leave by Ms. Riker, and had refused to leave.  Vernona Rieger stated that [employer] was called.  I immediately went to the room and found the FOB lying on the couch and Ms. Millman was in the bathroom with the door locked.   I identifyed myself and stated I understood he had been asked to leave.  He said"No he was not".  I then knocked on the bathroom door and asked Ms. Cappelletti if she had asked her support person to leave. She answered, "no, I want him here".  When asked why she was in the bathroom with the door locked, she said she wanted to take a shower.   I left the room and asked the staff to relay what had happened.  Adolm Joseph the RN,stated Ms. Touch and her boyfriend (FOB) were arguing loudly. The nurses at the desk all heard Ms. Cure tell her boyfriend to leave, and heard him refuse. Barton Dubois asked the boyfriend to leave he swore at her and left the unit only to circle back and immediately return to the room.   Apparently they were arguing over the ownership of a laptop computer.  Ms. Pereyra insisted it belonged to her.  The boyfriend insisted it was his and he was not leaving without it.    I returned to the room and told Ms. Mian's boyfriend I was told they were arguing loudly and if it happened again, he would be escorted from the hospital.  He said he understood and added that it would take more people than we have here to carry him out.  I made it clear to him, we would not carry him out, we would not touch him.  We would call [employer].  At that point he put a pillow over his head and turned away from me and I left the room.  At that moment, 2 [employer] arrived on the unit.  I explained the scenario.  They left shortly afterwards.

## 2014-05-11 NOTE — Progress Notes (Signed)
CSW attempted again to meet with patient to further discuss her situation and to ask about incident with FOB last night as reported by staff, but FOB was asleep in bed with patient, and therefore not an ideal time to talk with patient.  CSW asked if patient was okay right now and patient smiled and said "yes."  CSW will return again tomorrow.

## 2014-05-11 NOTE — Progress Notes (Signed)
Patient ID: Rachel Meadows, female   DOB: 24-Dec-1990, 23 y.o.   MRN: [id number]  FACULTY PRACTICE ANTEPARTUM NOTE  Rachel Meadows is a 23 y.o. G3P2002 at [redacted]w[redacted]d  who is admitted for PPROM.   Fetal presentation is cephalic. Length of Stay:  12  Days  Subjective:  Patient reports good fetal movement.  She reports no uterine contractions.  Having pinkish vaginal fluid - continues to leak fluid.  Appetite good.  Mild abdominal pain.    Vitals:  Blood pressure 116/83, pulse 103, temperature 98 F (36.7 C), temperature source Oral, resp. rate 18, height 5' (1.524 m), weight 72.258 kg (159 lb 4.8 oz), SpO2 98.00%. Physical Examination:  General appearance - alert, well appearing, and in no distress Heart: regular rate, no murmur Lungs: clear to auscultation bilaterally, no wheezing. Abdomen: Mild tenderness to uterus. Fundal Height:  size equals dates Extremities: extremities normal, atraumatic, no cyanosis or edema Fetal Monitoring:  Baseline: 130s bpm, Variability: Good {> 6 bpm), Accelerations: Reactive and Decelerations: Absent  Labs:  No results found for this or any previous visit (from the past 24 hour(s)).  Imaging Studies:     9/8: Vtx presentation with BPP 8/8.  Medications:  Scheduled . docusate sodium  100 mg Oral Daily  . prenatal multivitamin  1 tablet Oral Q1200  . sodium chloride  3 mL Intravenous Q12H   I have reviewed the patient's current medications.  ASSESSMENT: Patient Active Problem List   Diagnosis Date Noted  . Chlamydia infection affecting pregnancy in second trimester, antepartum 04/30/2014  . Preterm premature rupture of membranes in third trimester 04/29/2014  . Marijuana use 01/04/2014  . Supervision of other normal pregnancy 01/03/2014  . Smoker 01/03/2014    PLAN: S/P latency antibiotics, BMZ. GBS neg. Continue routine antenatal care.   STINSON, JACOB JEHIEL 05/11/2014,6:50 AM

## 2014-05-12 NOTE — Progress Notes (Signed)
CSW was informed of an argument between patient and FOB that was significant enough to alert nurses and security on 05/10/14.CSW attempted to talk with patient on 05/11/14 but entered the room to find FOB sleeping in patient bed with her (see note from 05/11/14). Today, CSW went to follow up on patient and ask her to discuss the situation/ensure safety. She was guarded with the details, but states, "I've been here for two weeks".  CSW is understanding. She reported that FOB had an attitude with the nurses and he was upset about her using a laptop in the bathroom while she showered. She explained, "All I wanted to do was shower and listen to music." FOB was worried the laptop would get wet and wanted to retrieve it. Patient locked herself in the bathroom. CSW acknowledged the difficulties in being in the hospital for an extended period of time and how it is hard not to have privacy. CSW also explained that hospital staff needs to ensure that she feels safe not only as a patient in the hospital, but CSW is concerned about her safety when she goes home as well. She assures CSW that she is "fine." CSW asked about her physical and emotional health, and she reported with nervous laughter again that everything was fine. At that moment FOB entered the room and went straight into the bathroom. There was no greeting between he and patient or CSW. CSW inquired about how her other children are doing and if her mother and stepfather are going to be able to move them back to [facility] ([facility] She reports her children are fine and having fun in [facility], adding that they went to [employer] this past weekend.  She states they live approximately 30 minutes away and go frequently.  She states her mother has offered to bring the children "down here" to visit, but she requests that her mother not bring them while she is in the hospital.  She feels they will not understand why she can't leave with them. CSW feels as though patient is withholding information on  the true dynamic of the relationship between her and FOB and on the situation regarding the living arrangement of her children. CSW plans to continue to visit with patient in hopes that she will open up gradually.  CSW spoke to RN regarding the incident on 05/10/14, who reported that FOB was loudly knocking and trying to get the bathroom door open, and when RN came into the room he became irate and was cursing at [employer]. RN reports FOB said this wasn't her business, that this was his family, and that he was about to go the f%$k off on someone. At that point security was called and requested the FOB take a walk and take some time to himself. He took a very brief lap around the hallway and then returned to the room.

## 2014-05-12 NOTE — Progress Notes (Signed)
Patient ID: Rachel Meadows, female   DOB: Feb 20, 1991, 23 y.o.   MRN: [id number] FACULTY PRACTICE ANTEPARTUM(COMPREHENSIVE) NOTE  AMADI YOSHINO is a 23 y.o. G3P2002 at [redacted]w[redacted]d  who is admitted for PROM.   Fetal presentation is cephalic. Length of Stay:  13  Days  Subjective:  Patient reports the fetal movement as active. Patient reports uterine contraction  activity as none. Patient reports  vaginal bleeding as none. Patient describes fluid per vagina as Clear.  Vitals:  Blood pressure 118/49, pulse 78, temperature 98.4 F (36.9 C), temperature source Oral, resp. rate 18, height 5' (1.524 m), weight 163 lb 6.4 oz (74.118 kg), SpO2 98.00%. Physical Examination:  General appearance - alert, well appearing, and in no distress Heart - normal rate and regular rhythm Abdomen - soft, nontender, nondistended Fundal Height:  size equals dates and not tender Cervical Exam: Not evaluated. Extremities: extremities normal Membranes:ruptured, clear fluid  Fetal Monitoring:  Baseline: 150 bpm, Variability: Good {> 6 bpm), Accelerations: Reactive and Decelerations: Absent  Labs:  No results found for this or any previous visit (from the past 24 hour(s)).  Imaging Studies:     Currently EPIC will not allow sonographic studies to automatically populate into notes.  In the meantime, copy and paste results into note or free text.  Medications:  Scheduled . docusate sodium  100 mg Oral Daily  . prenatal multivitamin  1 tablet Oral Q1200  . sodium chloride  3 mL Intravenous Q12H   Prior to Admission:  Prescriptions prior to admission  Medication Sig Dispense Refill  . Prenatal Vit-Fe Fumarate-FA (PRENATAL VITAMIN PO) Take by mouth daily.      Burnis Medin w/o A-FeCbGl-DSS-FA-DHA (CITRANATAL ASSURE) 300 MG MISC One tablet and one capsule daily  30 each  11    ASSESSMENT: Patient Active Problem List   Diagnosis Date Noted  . Chlamydia infection affecting pregnancy in second trimester, antepartum  04/30/2014  . Preterm premature rupture of membranes in third trimester 04/29/2014  . Marijuana use 01/04/2014  . Supervision of other normal pregnancy 01/03/2014  . Smoker 01/03/2014   S/P latency antibiotics, BMZ.  GBS neg.  Continue routine antenatal care.      ARNOLD,JAMES 05/12/2014,11:01 AM

## 2014-05-12 NOTE — Progress Notes (Signed)
UR completed 

## 2014-05-13 NOTE — Progress Notes (Signed)
Pt told me she was going for a wheel chair ride and will be back at  2038

## 2014-05-13 NOTE — Progress Notes (Signed)
CSW followed up with patient to process how she is feeling at this time.  She was more open to discussing the incident between patient and FOB on 05/10/14. Patient was in good spirits, and was very relaxed in her affect and speech during the discussion. CSW asked for more details about the relationship dynamic between patient and FOB. The patient revealed that FOB is frustrated and confused about this high-risk pregnancy. She added that since this is his first baby that he is especially sensitive about how she is cared for. Patient added that he is under a lot of stress training for his new job, in addition to running all errands for her since she is immobile. She explained that all these factors contributed to his verbal aggression towards RN. She stated that she talked with FOB on 05/11/14 about his behavior and said, "You can't talk to the nurses like that, and that this is a hospital." She reported that FOB was understanding, and that it is a rarity for him to be verbally aggressive.

## 2014-05-13 NOTE — Progress Notes (Signed)
I had a brief conversation with Rachel Meadows whom I met in the hallway.  She reports that she is coping well for the time being and that she is gearing up for staying several more weeks in hopes of giving her baby a healthy start to life.  This meeting served as an introduction to [employer].  She is aware of our on-going availability for emotional or spiritual support.  We will continue to follow her throughout her time in the hospital, but please also page as needs arise.  [place] Pager, [phone] 1:33 PM   05/13/14 1300  Clinical Encounter Type  Visited With Patient  Visit Type Initial

## 2014-05-13 NOTE — Progress Notes (Signed)
Patient ID: Rachel Meadows, female DOB: [birth year], 23 y.o. MRN: [id number]  FACULTY PRACTICE ANTEPARTUM(COMPREHENSIVE) NOTE  Rachel Meadows is a 23 y.o. G3P2002 at [redacted]w[redacted]d who is admitted for PROM.  Fetal presentation is cephalic.  Length of Stay: 14 Days  Subjective:  Patient reports the fetal movement as active.  Patient reports uterine contraction activity as none.  Patient reports vaginal bleeding as none.  Patient describes fluid per vagina as Clear.  Filed Vitals:   05/12/14 0815 05/12/14 1557 05/12/14 2027 05/13/14 0011  BP: 118/49 108/47 107/69 94/45  Pulse: 78 84 100 86  Temp: 98.4 F (36.9 C) 98.2 F (36.8 C) 98 F (36.7 C) 97.8 F (36.6 C)  TempSrc: Oral Oral Oral Oral  Resp: Height:      Weight:      SpO2:         Physical Examination:  General appearance - alert, well appearing, and in no distress  Heart - normal rate and regular rhythm  Abdomen - soft, nontender, nondistended  Fundal Height: size equals dates and not tender  Cervical Exam: Not evaluated.  Extremities: extremities normal  Membranes:ruptured, clear fluid  Fetal Monitoring: Baseline: 150 bpm, Variability: Good {> 6 bpm), Accelerations: Reactive and Decelerations: Absent  Labs:  No results found for this or any previous visit (from the past 24 hour(s)).  Imaging Studies:  Currently EPIC will not allow sonographic studies to automatically populate into notes. In the meantime, copy and paste results into note or free text.  Medications: Scheduled  .  docusate sodium  100 mg  Oral  Daily   .  prenatal multivitamin  1 tablet  Oral  Q1200   .  sodium chloride  3 mL  Intravenous  Q12H    Prior to Admission:  Prescriptions prior to admission   Medication  Sig  Dispense  Refill   .  Prenatal Vit-Fe Fumarate-FA (PRENATAL VITAMIN PO)  Take by mouth daily.     Burnis Medin w/o A-FeCbGl-DSS-FA-DHA (CITRANATAL ASSURE) 300 MG MISC  One tablet and one capsule daily  30 each  11    ASSESSMENT:   Patient Active Problem List    Diagnosis  Date Noted   .  Chlamydia infection affecting pregnancy in second trimester, antepartum  04/30/2014   .  Preterm premature rupture of membranes in third trimester  04/29/2014   .  Marijuana use  01/04/2014   .  Supervision of other normal pregnancy  01/03/2014   .  Smoker  01/03/2014    S/P latency antibiotics, BMZ.  GBS neg.  Continue routine antenatal care.    Rachel Meadows  05/13/2014 7:22 AM

## 2014-05-14 NOTE — Progress Notes (Signed)
Patient ID: Rachel Meadows, female   DOB: 12-20-1990, 23 y.o.   MRN: [id number] FACULTY PRACTICE ANTEPARTUM(COMPREHENSIVE) NOTE  DWAYNE BEGAY is a 23 y.o. G3P2002 at [redacted]w[redacted]d Estimated Date of Delivery: 07/17/14  who is admitted for PROM.  Fetal presentation is cephalic.  Length of Stay: 15 Days  Subjective:  Patient reports the fetal movement as active.  Patient reports uterine contraction activity as none.  Patient reports vaginal bleeding as none.  Patient describes fluid per vagina as Clear.  Filed Vitals:    05/12/14 0815  05/12/14 1557  05/12/14 2027  05/13/14 0011   BP:  118/49  108/47  107/69  94/45   Pulse:  78  84  100  86   Temp:  98.4 F (36.9 C)  98.2 F (36.8 C)  98 F (36.7 C)  97.8 F (36.6 C)   TempSrc:  Oral  Oral  Oral  Oral   Resp:  Height:       Weight:       SpO2:        Physical Examination:  General appearance - alert, well appearing, and in no distress  Heart - normal rate and regular rhythm  Abdomen - soft, nontender, nondistended  Fundal Height: size equals dates and not tender  Cervical Exam: Not evaluated.  Extremities: extremities normal  Membranes:ruptured, clear fluid  Fetal Monitoring: Baseline: 150 bpm, Variability: Good {> 6 bpm), Accelerations: Reactive and Decelerations: Absent  Labs:  No results found for this or any previous visit (from the past 24 hour(s)).  Imaging Studies:  Currently EPIC will not allow sonographic studies to automatically populate into notes. In the meantime, copy and paste results into note or free text.  Medications: Scheduled  .  docusate sodium  100 mg  Oral  Daily   .  prenatal multivitamin  1 tablet  Oral  Q1200   .  sodium chloride  3 mL  Intravenous  Q12H   Prior to Admission:  Prescriptions prior to admission   Medication  Sig  Dispense  Refill   .  Prenatal Vit-Fe Fumarate-FA (PRENATAL VITAMIN PO)  Take by mouth daily.     Burnis Medin w/o A-FeCbGl-DSS-FA-DHA (CITRANATAL ASSURE) 300 MG MISC   One tablet and one capsule daily  30 each  11   ASSESSMENT: [redacted]w[redacted]d Estimated Date of Delivery: 07/17/14 PPROM vertex  Patient Active Problem List    Diagnosis  Date Noted   .  Chlamydia infection affecting pregnancy in second trimester, antepartum  04/30/2014   .  Preterm premature rupture of membranes in third trimester  04/29/2014   .  Marijuana use  01/04/2014   .  Supervision of other normal pregnancy  01/03/2014   .  Smoker  01/03/2014   S/P latency antibiotics, BMZ.  GBS neg.  Continue routine antenatal care.

## 2014-05-15 NOTE — Plan of Care (Signed)
Problem: Consults Goal: Lactation Consult Initiated if indicated Outcome: Progressing See lactation nurse on expression of colostum after delivery for NICU stay. Goal: Chaplain Consult Outcome: Progressing Will consult for Chaplain to stop and see pt.   Problem: Phase I Progression Outcomes Goal: Diabetic Assessment Outcome: Not Applicable Date Met:  79/44/46 Pt. Not a diabetic Goal: LOS < 4 days Outcome: Not Applicable Date Met:  19/01/22 Pt. Adm. On 04/29/2014.

## 2014-05-15 NOTE — Progress Notes (Addendum)
FACULTY PRACTICE ANTEPARTUM(COMPREHENSIVE) NOTE   Rachel Meadows is a 23 y.o. Z6X0960 at [redacted]w[redacted]d Estimated Date of Delivery: 07/17/14  who is admitted for PROM.   Fetal presentation is cephalic.   Length of Stay: 16 Days   Subjective:  Patient reports leaking of fluid and minimal bleeding overnight, none currently Patient reports the fetal movement as active.  Patient reports uterine contraction activity as none.  Patient reports vaginal bleeding as minimal.  Patient describes fluid per vagina as Clear.   Physical Examination:  Filed Vitals:   05/14/14 1534 05/14/14 1951 05/14/14 1952 05/14/14 2321  BP: 116/56  111/64 95/57  Pulse: 98 104 104 81  Temp:   97.7 F (36.5 C) 98.1 F (36.7 C)  TempSrc:   Oral Oral  Resp:   20 20  Height:      Weight:      SpO2:  98%     General appearance - alert, well appearing, and in no distress  Heart - normal rate and regular rhythm  Abdomen - soft, nontender, nondistended  Fundal Height: size equals dates and not tender  Cervical Exam: Not evaluated.  Extremities: extremities normal  Membranes:ruptured, clear fluid  Fetal Monitoring: Baseline: 150 bpm, Variability: Moderate {> 6 bpm), Accelerations: Reactive and Decelerations: Absent   Labs: No results found for this or any previous visit (from the past 24 hour(s)).   Imaging Studies: None recently  Medications: Scheduled  . docusate sodium  100 mg Oral Daily  . prenatal multivitamin  1 tablet Oral Q1200    ASSESSMENT: Estimated Date of Delivery: 07/17/14  Patient Active Problem List    Diagnosis  Date Noted   .  Chlamydia infection affecting pregnancy in second trimester, antepartum  04/30/2014   .  Preterm premature rupture of membranes in third trimester  04/29/2014   .  Marijuana use  01/04/2014   .  Supervision of other normal pregnancy  01/03/2014   .  Smoker  01/03/2014   S/P latency antibiotics, BMZ.  GBS neg.  Continue routine antenatal care.    Tereso Newcomer,  MD 05/15/2014 6:03 AM

## 2014-05-16 NOTE — Progress Notes (Signed)
Ur chart review completed.  

## 2014-05-17 NOTE — Progress Notes (Signed)
FACULTY PRACTICE ANTEPARTUM(COMPREHENSIVE) NOTE   Rachel Meadows is a 23 y.o. Z6X0960 at [redacted]w[redacted]d Estimated Date of Delivery: 07/17/14  who is admitted for PROM.   Fetal presentation is cephalic.   Length of Stay: 18 Days   Subjective:  Patient reports leaking of fluid overnight, none currently Patient reports the fetal movement as active.  Patient reports uterine contraction activity as none.  Patient reports vaginal bleeding as minimal.  Patient describes fluid per vagina as Clear.   Physical Examination:  Filed Vitals:   05/16/14 1153 05/16/14 1611 05/16/14 1953 05/17/14 0003  BP: 111/61 114/60 118/61 119/69  Pulse: 109 98 104 93  Temp: 98.1 F (36.7 C) 98.5 F (36.9 C) 97.6 F (36.4 C) 97.8 F (36.6 C)  TempSrc: Oral Oral Axillary Oral  Resp: Height:      Weight:      SpO2:       General appearance - alert, well appearing, and in no distress  Heart - normal rate and regular rhythm  Abdomen - soft, nontender, nondistended  Fundal Height: size equals dates and not tender  Cervical Exam: Not evaluated.  Extremities: extremities normal  Membranes:ruptured, clear fluid   Fetal Monitoring: Baseline: 145 bpm, Variability: Moderate {> 6 bpm), Accelerations: Reactive and Decelerations: Absent   Labs: No results found for this or any previous visit (from the past 24 hour(s)).   Imaging Studies: None recently  Medications: Scheduled  . docusate sodium  100 mg Oral Daily  . prenatal multivitamin  1 tablet Oral Q1200    ASSESSMENT: Estimated Date of Delivery: 07/17/14  Patient Active Problem List    Diagnosis  Date Noted   .  Chlamydia infection affecting pregnancy in second trimester, antepartum  04/30/2014   .  Preterm premature rupture of membranes in third trimester  04/29/2014   .  Marijuana use  01/04/2014   .  Supervision of other normal pregnancy  01/03/2014   .  Smoker  01/03/2014   S/P latency antibiotics, BMZ.  GBS neg Deliver for signs/symptoms  of chorioamnionitis or other maternal-fetal indication Continue routine antenatal care.    Tereso Newcomer, MD 05/17/2014 7:39 AM

## 2014-05-18 NOTE — Progress Notes (Signed)
Patient ID: Rachel Meadows, female   DOB: [birth year], 23 y.o.   MRN: [id number]                  FACULTY PRACTICE ANTEPARTUM(COMPREHENSIVE) NOTE  Rachel Meadows is a 23 y.o. G3P2002 at [redacted]w[redacted]d Estimated Date of Delivery: 07/17/14 who is admitted for PROM.  Fetal presentation is cephalic.  Length of Stay: 19 Days  Subjective:  Patient reports leaking of fluid overnight, none currently  Patient reports the fetal movement as active.  Patient reports uterine contraction activity as none.  Patient reports vaginal bleeding as minimal.  Patient describes fluid per vagina as Clear.  Physical Examination:     Filed Vitals:      05/16/14 1153  05/16/14 1611  05/16/14 1953  05/17/14 0003     BP:  111/61  114/60  118/61  119/69     Pulse:  109  98  104  93     Temp:  98.1 F (36.7 C)  98.5 F (36.9 C)  97.6 F (36.4 C)  97.8 F (36.6 C)     TempSrc:  Oral  Oral  Axillary  Oral     Resp:  Height:         Weight:         SpO2:         General appearance - alert, well appearing, and in no distress  Heart - normal rate and regular rhythm  Abdomen - soft, nontender, nondistended  Fundal Height: size equals dates and not tender  Cervical Exam: Not evaluated.  Extremities: extremities normal  Membranes:ruptured, clear fluid  Fetal Monitoring: Baseline: 145 bpm, Variability: Moderate {> 6 bpm), Accelerations: Reactive and Decelerations: Absent  Labs: No results found for this or any previous visit (from the past 24 hour(s)).  Imaging Studies: None recently  Medications: Scheduled     .  docusate sodium  100 mg  Oral  Daily     .  prenatal multivitamin  1 tablet  Oral  Q1200     ASSESSMENT:  Estimated Date of Delivery: 07/17/14     Patient Active Problem List      Diagnosis  Date Noted     .  Chlamydia infection affecting pregnancy in second trimester, antepartum  04/30/2014     .  Preterm premature rupture of membranes in third trimester  04/29/2014     .  Marijuana use   01/04/2014     .  Supervision of other normal pregnancy  01/03/2014     .  Smoker  01/03/2014     S/P latency antibiotics, BMZ.  GBS neg  Deliver for signs/symptoms of chorioamnionitis or other maternal-fetal indication  Continue routine antenatal care.

## 2014-05-18 NOTE — Plan of Care (Signed)
Problem: Phase I Progression Outcomes Goal: Other Phase I Outcomes/Goals Outcome: Progressing Instructed patient on prolonged hospitalization with PROM. Instructed on signs and symptoms of PTL. Patient receptive.

## 2014-05-19 LAB — CBC
HCT: 30.6 % — ABNORMAL LOW (ref 36.0–46.0)
Hemoglobin: 10.5 g/dL — ABNORMAL LOW (ref 12.0–15.0)
MCH: 31.8 pg (ref 26.0–34.0)
MCHC: 34.3 g/dL (ref 30.0–36.0)
MCV: 92.7 fL (ref 78.0–100.0)
Platelets: 239 10*3/uL (ref 150–400)
RBC: 3.3 MIL/uL — ABNORMAL LOW (ref 3.87–5.11)
RDW: 14 % (ref 11.5–15.5)
WBC: 21.4 10*3/uL — ABNORMAL HIGH (ref 4.0–10.5)

## 2014-05-19 MED ORDER — OXYCODONE-ACETAMINOPHEN 5-325 MG PO TABS
1.0000 | ORAL_TABLET | Freq: Four times a day (QID) | ORAL | Status: DC | PRN
  Administered 2014-05-19 – 2014-05-21 (×3): 1 via ORAL
  Filled 2014-05-19 (×3): qty 1

## 2014-05-19 MED ORDER — SODIUM CHLORIDE 0.9 % IJ SOLN
3.0000 mL | Freq: Three times a day (TID) | INTRAMUSCULAR | Status: DC
  Administered 2014-05-20 – 2014-05-21 (×4): 3 mL via INTRAVENOUS

## 2014-05-19 NOTE — Progress Notes (Signed)
Patient ID: Rachel Meadows, female   DOB: [birth year], 23 y.o.   MRN: [id number] ACULTY PRACTICE ANTEPARTUM COMPREHENSIVE PROGRESS NOTE  Rachel Meadows is a 23 y.o. G3P2002 at [redacted]w[redacted]d  who is admitted for PROM.   Fetal presentation is cephalic. Length of Stay:  20  Days  Subjective: Pt reports feeling sore and feels that it is related to 'the bed'.  She feels that it is uncomfortable. Patient reports good fetal movement.  She reports occ uterine contractions, no bleeding but, continued loss of fluid per vagina.  Vitals:  Blood pressure 109/62, pulse 102, temperature 98.4 F (36.9 C), temperature source Oral, resp. rate 18, height 5' (1.524 m), weight 167 lb 9.6 oz (76.023 kg), SpO2 98.00%. Physical Examination: General appearance - alert, well appearing, and in no distress Cervical Exam: Not evaluated.  Extremities: extremities normal, atraumatic, no cyanosis or edema  Membranes:intact  Fetal Monitoring:  Baseline: 150's bpm and Variability: Good {> 6 bpm)  Labs:  No results found for this or any previous visit (from the past 24 hour(s)).  Medications:  Scheduled . docusate sodium  100 mg Oral Daily  . prenatal multivitamin  1 tablet Oral Q1200   I have reviewed the patient's current medications.  ASSESSMENT: Patient Active Problem List   Diagnosis Date Noted  . Chlamydia infection affecting pregnancy in second trimester, antepartum 04/30/2014  . Preterm premature rupture of membranes in third trimester 04/29/2014  . Marijuana use 01/04/2014  . Supervision of other normal pregnancy 01/03/2014  . Smoker 01/03/2014    PLAN: Keep in pt Deliver at 34weeks or sooner prn for maternal or fetal indications Egg carte for bed Continue routine antenatal care.   HARRAWAY-SMITH, Ariyannah Pauling 05/19/2014,11:09 AM

## 2014-05-19 NOTE — Plan of Care (Signed)
Problem: Phase I Progression Outcomes Goal: No significant worsening bleeding/cervix change/vag drainage No significant worsening in vaginal bleeding, cervical change, or vaginal drainage.  Outcome: Not Applicable Date Met:  46/65/99 Pt inpatient until delivery

## 2014-05-19 NOTE — Progress Notes (Signed)
Patient and boyfriend arguing in the room, I ask her if I need to call security she denies the need at this time.

## 2014-05-19 NOTE — Progress Notes (Signed)
UR completed 

## 2014-05-20 LAB — WET PREP, GENITAL
Clue Cells Wet Prep HPF POC: NONE SEEN
Trich, Wet Prep: NONE SEEN
YEAST WET PREP: NONE SEEN

## 2014-05-20 NOTE — Progress Notes (Signed)
CSW followed up with patient to check on her emotional well-being and offer support. When CSW entered room, FOB was restlessly sleeping on the couch. CSW asked if this was an appropriate time to talk and patient said yes. CSW notes that patient's affect was upbeat and she reported no emotional issues. CSW asked about patient's current support people and if they have been coming to see her on a regular basis, she reported yes. CSW notes that patient is hesitant to share details regarding her emtional needs and the dynamics of relationship with FOB. CSW will follow up again tomorrow hopefully when FOB is not present.

## 2014-05-20 NOTE — Progress Notes (Signed)
Patient ID: Rachel Meadows, female   DOB: 1991-02-04, 23 y.o.   MRN: [id number] ACULTY PRACTICE ANTEPARTUM COMPREHENSIVE PROGRESS NOTE  Rachel Meadows is a 23 y.o. G3P2002 at [redacted]w[redacted]d  who is admitted for PROM.   Fetal presentation is cephalic. Length of Stay:  21  Days  Subjective: Pt reports that she had some 'green' discharge last night.  She reports that her pain from yesterday has improved  Patient reports good fetal movement.  She reports occ uterine contractions, no bleeding but, continued no loss of fluid per vagina.  Vitals:  Blood pressure 101/40, pulse 105, temperature 98.1 F (36.7 C), temperature source Oral, resp. rate 20, height 5' (1.524 m), weight 167 lb 9.6 oz (76.023 kg), SpO2 98.00%. Physical Examination: General appearance - alert, well appearing, and in no distress Abdomen - soft, nontender, nondistended, no masses or organomegaly Gravid.  Having a mildly palpable contractions   Extremities - peripheral pulses normal, no pedal edema, no clubbing or cyanosis Cervical Exam: Not evaluated. Membranes:ruptured  Fetal Monitoring:  Baseline: 140's bpm, Variability: Good {> 6 bpm), Accelerations: Reactive and TOCO: reg ctx  Labs:  Results for orders placed during the hospital encounter of 04/29/14 (from the past 24 hour(s))  CBC   Collection Time    05/19/14  7:53 PM      Result Value Ref Range   WBC 21.4 (*) 4.0 - 10.5 K/uL   RBC 3.30 (*) 3.87 - 5.11 MIL/uL   Hemoglobin 10.5 (*) 12.0 - 15.0 g/dL   HCT 91.4 (*) 78.2 - 95.6 %   MCV 92.7  78.0 - 100.0 fL   MCH 31.8  26.0 - 34.0 pg   MCHC 34.3  30.0 - 36.0 g/dL   RDW 21.3  08.6 - 57.8 %   Platelets 239  150 - 400 K/uL    Imaging Studies:    None recent   Medications:  Scheduled . docusate sodium  100 mg Oral Daily  . prenatal multivitamin  1 tablet Oral Q1200  . sodium chloride  3 mL Intravenous Q8H   I have reviewed the patient's current medications.  ASSESSMENT: Patient Active Problem List   Diagnosis Date  Noted  . Chlamydia infection affecting pregnancy in second trimester, antepartum 04/30/2014  . Preterm premature rupture of membranes in third trimester 04/29/2014  . Marijuana use 01/04/2014  . Supervision of other normal pregnancy 01/03/2014  . Smoker 01/03/2014    PLAN: Pt with increased amount of discharge and elevated WBC but, FHR looks good d/w mother who is eager to wait 2 more weeks.  Reviewed s/sx of chorioamnionic.  She will call if her sx changer Continue routine antenatal care.   HARRAWAY-SMITH, Cope Marte 05/20/2014,7:26 AM

## 2014-05-21 ENCOUNTER — Inpatient Hospital Stay (HOSPITAL_COMMUNITY): Payer: Medicaid Other | Admitting: Anesthesiology

## 2014-05-21 ENCOUNTER — Encounter (HOSPITAL_COMMUNITY)
Admission: EM | Disposition: A | Discharge status: Home / Self Care | Payer: Self-pay | Source: Home / Self Care | Attending: Obstetrics & Gynecology

## 2014-05-21 ENCOUNTER — Encounter (HOSPITAL_COMMUNITY): Payer: Medicaid Other | Admitting: Anesthesiology

## 2014-05-21 ENCOUNTER — Encounter (HOSPITAL_COMMUNITY): Payer: Self-pay

## 2014-05-21 DIAGNOSIS — O99334 Smoking (tobacco) complicating childbirth: Secondary | ICD-10-CM

## 2014-05-21 DIAGNOSIS — O411090 Infection of amniotic sac and membranes, unspecified, unspecified trimester, not applicable or unspecified: Secondary | ICD-10-CM

## 2014-05-21 DIAGNOSIS — O4290 Premature rupture of membranes, unspecified as to length of time between rupture and onset of labor, unspecified weeks of gestation: Secondary | ICD-10-CM

## 2014-05-21 LAB — TYPE AND SCREEN
ABO/RH(D): O POS
ANTIBODY SCREEN: NEGATIVE

## 2014-05-21 LAB — CBC
HCT: 30.6 % — ABNORMAL LOW (ref 36.0–46.0)
Hemoglobin: 10.4 g/dL — ABNORMAL LOW (ref 12.0–15.0)
MCH: 31.6 pg (ref 26.0–34.0)
MCHC: 34 g/dL (ref 30.0–36.0)
MCV: 93 fL (ref 78.0–100.0)
PLATELETS: 223 10*3/uL (ref 150–400)
RBC: 3.29 MIL/uL — ABNORMAL LOW (ref 3.87–5.11)
RDW: 14 % (ref 11.5–15.5)
WBC: 13.6 10*3/uL — AB (ref 4.0–10.5)

## 2014-05-21 LAB — ABO/RH: ABO/RH(D): O POS

## 2014-05-21 SURGERY — Surgical Case
Anesthesia: Spinal | Site: Abdomen

## 2014-05-21 MED ORDER — MENTHOL 3 MG MT LOZG: 1.0000 | LOZENGE | OROMUCOSAL | Status: DC | PRN

## 2014-05-21 MED ORDER — MIDAZOLAM HCL 2 MG/2ML IJ SOLN: 0.5000 mg | Freq: Once | INTRAMUSCULAR | Status: DC | PRN

## 2014-05-21 MED ORDER — SENNOSIDES-DOCUSATE SODIUM 8.6-50 MG PO TABS
2.0000 | ORAL_TABLET | ORAL | Status: DC
  Administered 2014-05-22 (×2): 2 via ORAL
  Filled 2014-05-21 (×2): qty 2

## 2014-05-21 MED ORDER — OXYTOCIN 40 UNITS IN LACTATED RINGERS INFUSION - SIMPLE MED: 62.5000 mL/h | INTRAVENOUS | Status: AC

## 2014-05-21 MED ORDER — PHENYLEPHRINE 8 MG IN D5W 100 ML (0.08MG/ML) PREMIX OPTIME
INJECTION | INTRAVENOUS | Status: DC | PRN
  Administered 2014-05-21: 60 ug/min via INTRAVENOUS

## 2014-05-21 MED ORDER — PHENYLEPHRINE 8 MG IN D5W 100 ML (0.08MG/ML) PREMIX OPTIME
INJECTION | INTRAVENOUS | Status: AC
  Filled 2014-05-21: qty 100

## 2014-05-21 MED ORDER — SCOPOLAMINE 1 MG/3DAYS TD PT72
1.0000 | MEDICATED_PATCH | Freq: Once | TRANSDERMAL | Status: DC
  Administered 2014-05-21: 1.5 mg via TRANSDERMAL

## 2014-05-21 MED ORDER — OXYCODONE-ACETAMINOPHEN 5-325 MG PO TABS
2.0000 | ORAL_TABLET | ORAL | Status: DC | PRN
  Administered 2014-05-22 – 2014-05-23 (×4): 2 via ORAL
  Filled 2014-05-21 (×4): qty 2

## 2014-05-21 MED ORDER — MEPERIDINE HCL 25 MG/ML IJ SOLN: 6.2500 mg | INTRAMUSCULAR | Status: DC | PRN

## 2014-05-21 MED ORDER — ZOLPIDEM TARTRATE 5 MG PO TABS: 5.0000 mg | ORAL_TABLET | Freq: Every evening | ORAL | Status: DC | PRN

## 2014-05-21 MED ORDER — PROMETHAZINE HCL 25 MG/ML IJ SOLN: 6.2500 mg | INTRAMUSCULAR | Status: DC | PRN

## 2014-05-21 MED ORDER — CEFAZOLIN SODIUM-DEXTROSE 2-3 GM-% IV SOLR
INTRAVENOUS | Status: AC
  Filled 2014-05-21: qty 50

## 2014-05-21 MED ORDER — LACTATED RINGERS IV SOLN: INTRAVENOUS | Status: DC

## 2014-05-21 MED ORDER — LACTATED RINGERS IV SOLN
INTRAVENOUS | Status: DC
  Administered 2014-05-22: 06:00:00 via INTRAVENOUS

## 2014-05-21 MED ORDER — LACTATED RINGERS IV SOLN
INTRAVENOUS | Status: DC | PRN
  Administered 2014-05-21: 18:00:00 via INTRAVENOUS

## 2014-05-21 MED ORDER — WITCH HAZEL-GLYCERIN EX PADS: 1.0000 "application " | MEDICATED_PAD | CUTANEOUS | Status: DC | PRN

## 2014-05-21 MED ORDER — PRENATAL MULTIVITAMIN CH
1.0000 | ORAL_TABLET | Freq: Every day | ORAL | Status: DC
  Administered 2014-05-22: 1 via ORAL
  Filled 2014-05-21: qty 1

## 2014-05-21 MED ORDER — KETOROLAC TROMETHAMINE 30 MG/ML IJ SOLN: 30.0000 mg | Freq: Four times a day (QID) | INTRAMUSCULAR | Status: AC | PRN

## 2014-05-21 MED ORDER — ONDANSETRON HCL 4 MG/2ML IJ SOLN
INTRAMUSCULAR | Status: AC
  Filled 2014-05-21: qty 2

## 2014-05-21 MED ORDER — ONDANSETRON HCL 4 MG/2ML IJ SOLN: 4.0000 mg | Freq: Three times a day (TID) | INTRAMUSCULAR | Status: DC | PRN

## 2014-05-21 MED ORDER — SCOPOLAMINE 1 MG/3DAYS TD PT72
MEDICATED_PATCH | TRANSDERMAL | Status: DC
Start: 2014-05-21 — End: 2014-05-21
  Filled 2014-05-21: qty 1

## 2014-05-21 MED ORDER — ONDANSETRON HCL 4 MG PO TABS: 4.0000 mg | ORAL_TABLET | ORAL | Status: DC | PRN

## 2014-05-21 MED ORDER — FENTANYL CITRATE 0.05 MG/ML IJ SOLN: 25.0000 ug | INTRAMUSCULAR | Status: DC | PRN

## 2014-05-21 MED ORDER — CEFAZOLIN SODIUM-DEXTROSE 2-3 GM-% IV SOLR
INTRAVENOUS | Status: DC | PRN
  Administered 2014-05-21: 2 g via INTRAVENOUS

## 2014-05-21 MED ORDER — CITRIC ACID-SODIUM CITRATE 334-500 MG/5ML PO SOLN
30.0000 mL | Freq: Once | ORAL | Status: AC
  Administered 2014-05-21: 30 mL via ORAL
  Filled 2014-05-21: qty 15

## 2014-05-21 MED ORDER — SODIUM CHLORIDE 0.9 % IJ SOLN: 3.0000 mL | INTRAMUSCULAR | Status: DC | PRN

## 2014-05-21 MED ORDER — LACTATED RINGERS IV SOLN
INTRAVENOUS | Status: DC
  Administered 2014-05-21: 12:00:00 via INTRAVENOUS

## 2014-05-21 MED ORDER — KETOROLAC TROMETHAMINE 30 MG/ML IJ SOLN
INTRAMUSCULAR | Status: AC
  Filled 2014-05-21: qty 1

## 2014-05-21 MED ORDER — SIMETHICONE 80 MG PO CHEW: 80.0000 mg | CHEWABLE_TABLET | ORAL | Status: DC | PRN

## 2014-05-21 MED ORDER — IBUPROFEN 600 MG PO TABS: 600.0000 mg | ORAL_TABLET | Freq: Four times a day (QID) | ORAL | Status: DC | PRN

## 2014-05-21 MED ORDER — KETOROLAC TROMETHAMINE 30 MG/ML IJ SOLN
30.0000 mg | Freq: Once | INTRAMUSCULAR | Status: AC
  Administered 2014-05-21: 30 mg via INTRAVENOUS

## 2014-05-21 MED ORDER — HYDROMORPHONE HCL 1 MG/ML IJ SOLN
INTRAMUSCULAR | Status: AC
  Administered 2014-05-21: 0.5 mg via INTRAVENOUS
  Filled 2014-05-21: qty 1

## 2014-05-21 MED ORDER — NALBUPHINE HCL 10 MG/ML IJ SOLN: 5.0000 mg | INTRAMUSCULAR | Status: DC | PRN

## 2014-05-21 MED ORDER — FENTANYL CITRATE 0.05 MG/ML IJ SOLN
INTRAMUSCULAR | Status: AC
  Filled 2014-05-21: qty 2

## 2014-05-21 MED ORDER — NALBUPHINE HCL 10 MG/ML IJ SOLN: 5.0000 mg | Freq: Once | INTRAMUSCULAR | Status: AC | PRN

## 2014-05-21 MED ORDER — DIBUCAINE 1 % RE OINT: 1.0000 "application " | TOPICAL_OINTMENT | RECTAL | Status: DC | PRN

## 2014-05-21 MED ORDER — GENTAMICIN SULFATE 40 MG/ML IJ SOLN
150.0000 mg | Freq: Once | INTRAVENOUS | Status: AC
  Administered 2014-05-21: 150 mg via INTRAVENOUS
  Filled 2014-05-21: qty 3.75

## 2014-05-21 MED ORDER — LANOLIN HYDROUS EX OINT: 1.0000 "application " | TOPICAL_OINTMENT | CUTANEOUS | Status: DC | PRN

## 2014-05-21 MED ORDER — OXYTOCIN 10 UNIT/ML IJ SOLN
INTRAMUSCULAR | Status: AC
  Filled 2014-05-21: qty 4

## 2014-05-21 MED ORDER — DIPHENHYDRAMINE HCL 50 MG/ML IJ SOLN
12.5000 mg | INTRAMUSCULAR | Status: DC | PRN
Start: 2014-05-21 — End: 2014-05-23

## 2014-05-21 MED ORDER — MORPHINE SULFATE (PF) 0.5 MG/ML IJ SOLN
INTRAMUSCULAR | Status: DC | PRN
  Administered 2014-05-21 (×2): .5 mg via EPIDURAL
  Administered 2014-05-21: .4 mg via EPIDURAL
  Administered 2014-05-21: 1 mg via EPIDURAL
  Administered 2014-05-21: .1 mg via INTRATHECAL
  Administered 2014-05-21: 1.5 mg via EPIDURAL
  Administered 2014-05-21: 1 mg via EPIDURAL

## 2014-05-21 MED ORDER — DIPHENHYDRAMINE HCL 25 MG PO CAPS: 25.0000 mg | ORAL_CAPSULE | Freq: Four times a day (QID) | ORAL | Status: DC | PRN

## 2014-05-21 MED ORDER — TETANUS-DIPHTH-ACELL PERTUSSIS 5-2.5-18.5 LF-MCG/0.5 IM SUSP: 0.5000 mL | Freq: Once | INTRAMUSCULAR | Status: DC

## 2014-05-21 MED ORDER — SIMETHICONE 80 MG PO CHEW
80.0000 mg | CHEWABLE_TABLET | ORAL | Status: DC
  Administered 2014-05-22 (×2): 80 mg via ORAL
  Filled 2014-05-21 (×3): qty 1

## 2014-05-21 MED ORDER — FENTANYL CITRATE 0.05 MG/ML IJ SOLN
INTRAMUSCULAR | Status: DC | PRN
  Administered 2014-05-21: 50 ug via INTRAVENOUS
  Administered 2014-05-21: 25 ug via INTRAVENOUS
  Administered 2014-05-21: 25 ug via INTRATHECAL

## 2014-05-21 MED ORDER — NALOXONE HCL 1 MG/ML IJ SOLN
1.0000 ug/kg/h | INTRAVENOUS | Status: DC | PRN
  Filled 2014-05-21: qty 2

## 2014-05-21 MED ORDER — ACETAMINOPHEN 500 MG PO TABS
1000.0000 mg | ORAL_TABLET | Freq: Four times a day (QID) | ORAL | Status: DC
  Administered 2014-05-22 (×2): 1000 mg via ORAL
  Filled 2014-05-21 (×2): qty 2

## 2014-05-21 MED ORDER — HYDROMORPHONE HCL 1 MG/ML IJ SOLN
0.2500 mg | INTRAMUSCULAR | Status: DC | PRN
  Administered 2014-05-21 (×2): 0.5 mg via INTRAVENOUS

## 2014-05-21 MED ORDER — LACTATED RINGERS IV SOLN
INTRAVENOUS | Status: DC | PRN
  Administered 2014-05-21 (×2): via INTRAVENOUS

## 2014-05-21 MED ORDER — ONDANSETRON HCL 4 MG/2ML IJ SOLN: 4.0000 mg | INTRAMUSCULAR | Status: DC | PRN

## 2014-05-21 MED ORDER — GENTAMICIN SULFATE 40 MG/ML IJ SOLN
130.0000 mg | Freq: Three times a day (TID) | INTRAVENOUS | Status: DC
  Filled 2014-05-21 (×2): qty 3.25

## 2014-05-21 MED ORDER — TERBUTALINE SULFATE 1 MG/ML IJ SOLN: 0.2500 mg | Freq: Once | INTRAMUSCULAR | Status: DC | PRN

## 2014-05-21 MED ORDER — ONDANSETRON HCL 4 MG/2ML IJ SOLN
INTRAMUSCULAR | Status: DC | PRN
  Administered 2014-05-21: 4 mg via INTRAVENOUS

## 2014-05-21 MED ORDER — DIPHENHYDRAMINE HCL 25 MG PO CAPS: 25.0000 mg | ORAL_CAPSULE | ORAL | Status: DC | PRN

## 2014-05-21 MED ORDER — OXYCODONE-ACETAMINOPHEN 5-325 MG PO TABS
1.0000 | ORAL_TABLET | ORAL | Status: DC | PRN
  Administered 2014-05-22: 1 via ORAL
  Filled 2014-05-21: qty 1

## 2014-05-21 MED ORDER — SODIUM CHLORIDE 0.9 % IV SOLN
2.0000 g | Freq: Four times a day (QID) | INTRAVENOUS | Status: DC
  Administered 2014-05-21: 2 g via INTRAVENOUS
  Filled 2014-05-21 (×3): qty 2000

## 2014-05-21 MED ORDER — IBUPROFEN 600 MG PO TABS
600.0000 mg | ORAL_TABLET | Freq: Four times a day (QID) | ORAL | Status: DC
  Administered 2014-05-22 – 2014-05-23 (×5): 600 mg via ORAL
  Filled 2014-05-21 (×5): qty 1

## 2014-05-21 MED ORDER — NALOXONE HCL 0.4 MG/ML IJ SOLN: 0.4000 mg | INTRAMUSCULAR | Status: DC | PRN

## 2014-05-21 MED ORDER — OXYTOCIN 10 UNIT/ML IJ SOLN
40.0000 [IU] | INTRAMUSCULAR | Status: DC | PRN
  Administered 2014-05-21: 40 [IU] via INTRAVENOUS

## 2014-05-21 MED ORDER — MORPHINE SULFATE 0.5 MG/ML IJ SOLN
INTRAMUSCULAR | Status: AC
  Filled 2014-05-21: qty 10

## 2014-05-21 MED ORDER — SIMETHICONE 80 MG PO CHEW
80.0000 mg | CHEWABLE_TABLET | Freq: Three times a day (TID) | ORAL | Status: DC
  Administered 2014-05-22 – 2014-05-23 (×4): 80 mg via ORAL
  Filled 2014-05-21 (×2): qty 1

## 2014-05-21 SURGICAL SUPPLY — 30 items
BARRIER ADHS 3X4 INTERCEED (GAUZE/BANDAGES/DRESSINGS) IMPLANT
BLADE SURG 10 STRL SS (BLADE) ×6 IMPLANT
BRR ADH 4X3 ABS CNTRL BYND (GAUZE/BANDAGES/DRESSINGS)
CLAMP CORD UMBIL (MISCELLANEOUS) IMPLANT
CLOTH BEACON ORANGE TIMEOUT ST (SAFETY) ×3 IMPLANT
DRAPE SHEET LG 3/4 BI-LAMINATE (DRAPES) IMPLANT
DRSG OPSITE POSTOP 4X10 (GAUZE/BANDAGES/DRESSINGS) ×3 IMPLANT
DURAPREP 26ML APPLICATOR (WOUND CARE) ×3 IMPLANT
ELECT REM PT RETURN 9FT ADLT (ELECTROSURGICAL) ×3
ELECTRODE REM PT RTRN 9FT ADLT (ELECTROSURGICAL) ×1 IMPLANT
EXTRACTOR VACUUM KIWI (MISCELLANEOUS) IMPLANT
GLOVE BIO SURGEON STRL SZ 6.5 (GLOVE) ×2 IMPLANT
GLOVE BIO SURGEONS STRL SZ 6.5 (GLOVE) ×1
GLOVE BIOGEL PI IND STRL 7.0 (GLOVE) ×1 IMPLANT
GLOVE BIOGEL PI INDICATOR 7.0 (GLOVE) ×2
GOWN STRL REUS W/TWL LRG LVL3 (GOWN DISPOSABLE) ×6 IMPLANT
KIT ABG SYR 3ML LUER SLIP (SYRINGE) IMPLANT
NEEDLE HYPO 25X5/8 SAFETYGLIDE (NEEDLE) IMPLANT
NS IRRIG 1000ML POUR BTL (IV SOLUTION) ×3 IMPLANT
PACK C SECTION WH (CUSTOM PROCEDURE TRAY) ×3 IMPLANT
PAD OB MATERNITY 4.3X12.25 (PERSONAL CARE ITEMS) ×3 IMPLANT
RETRACTOR WND ALEXIS 25 LRG (MISCELLANEOUS) ×1 IMPLANT
RTRCTR WOUND ALEXIS 25CM LRG (MISCELLANEOUS) ×3
SUT VIC AB 0 CT1 36 (SUTURE) ×14 IMPLANT
SUT VIC AB 2-0 CT1 27 (SUTURE) ×3
SUT VIC AB 2-0 CT1 TAPERPNT 27 (SUTURE) ×1 IMPLANT
SUT VIC AB 4-0 PS2 27 (SUTURE) ×3 IMPLANT
TOWEL OR 17X24 6PK STRL BLUE (TOWEL DISPOSABLE) ×3 IMPLANT
TRAY FOLEY CATH 14FR (SET/KITS/TRAYS/PACK) IMPLANT
WATER STERILE IRR 1000ML POUR (IV SOLUTION) ×3 IMPLANT

## 2014-05-21 NOTE — Anesthesia Procedure Notes (Signed)
Spinal Patient location during procedure: OR Preanesthetic Checklist Completed: patient identified, site marked, surgical consent, pre-op evaluation, timeout performed, IV checked, risks and benefits discussed and monitors and equipment checked Spinal Block Patient position: sitting Prep: DuraPrep Patient monitoring: heart rate, cardiac monitor, continuous pulse ox and blood pressure Approach: midline Location: L3-4 Injection technique: single-shot Needle Needle type: Sprotte  Needle gauge: 24 G Needle length: 9 cm Assessment Sensory level: T4 Additional Notes Spinal Dosage in OR  Bupivicaine ml       1.2 PFMS04   mcg        100 Fentanyl mcg            25    

## 2014-05-21 NOTE — Anesthesia Postprocedure Evaluation (Signed)
Anesthesia Post Note  Patient: Rachel Meadows  Procedure(s) Performed: Procedure(s) (LRB): CESAREAN SECTION (N/A)  Anesthesia type: Spinal  Patient location: PACU  Post pain: Pain level controlled  Post assessment: Post-op Vital signs reviewed  Last Vitals:  Filed Vitals:   05/21/14 2015  BP: 107/55  Pulse: 74  Temp:   Resp: 19    Post vital signs: Reviewed  Level of consciousness: awake  Complications: No apparent anesthesia complications

## 2014-05-21 NOTE — Consult Note (Signed)
Neonatology Note:  Attendance at C-section:  I was asked by Dr. Arnold to attend this primary C/S at 31 6/7 weeks due to chorioamnionitis and breech presentation. The mother is a G3P2 O pos, GBS neg with PPROM on 8/28. She has been hospitalized since then, and received Betamethasone on 8/28-29, antibiotics, and Magnesium sulfate. The mother has a history of marijuana use, smokes 1/2 pack cigarettes/day, has some hearing loss, and has a history of panic attacks. Today, she had upper abdominal pain and contractions. She has gotten Ampicillin, Gentamicin, and has had Percocet times 2, and she remained afebrile today. Infant delivered breech and was somewhat floppy and blue at birth. We did bulb suctioning and gave stimulation; at first, respirations were irregular, but she began to cry at about 1.5 minutes and HR was always normal. We placed a pulse oximeter, which showed O2 saturations of 40% at 3-4 minutes; air exchange was decreased, so we placed the neopuff on the baby, with good response. Air exchange improved quickly, but she required 75-80% FIO2 to get her O2 saturations into the high 80s. Ap 6/9. Seen briefly by her parents in the OR, then transported to the NICU for further care. Her father was in attendance.  Rachel Sano C. Skarlet Lyons, MD  

## 2014-05-21 NOTE — Op Note (Signed)
Cesarean Section Procedure Note   Rachel Meadows  04/29/2014 - 05/21/2014  Indications: Breech Presentation, Infection and premature rupture of membranes   Pre-operative Diagnosis: Premature Rupture of membranes, chorioamnionitis, breech presentation.   Post-operative Diagnosis: Same   Surgeon: Surgeon(s) and Role:    * James G Arnold, MD - Primary   Assistants: none  Anesthesia: spinal   Procedure Details:  The patient was seen in the Holding Room. The risks, benefits, complications, treatment options, and expected outcomes were discussed with the patient. The patient concurred with the proposed plan, giving informed consent. identified as Rachel Meadows and the procedure verified as C-Section Delivery. A Time Out was held and the above information confirmed.  After induction of anesthesia, the patient was draped and prepped in the usual sterile manner. A transverse was made and carried down through the subcutaneous tissue to the fascia. Fascial incision was made and extended transversely. The fascia was separated from the underlying rectus tissue superiorly and inferiorly. The peritoneum was identified and entered. Peritoneal incision was extended longitudinally. The utero-vesical peritoneal reflection was incised transversely and the bladder flap was bluntly freed from the lower uterine segment. A low transverse uterine incision was made. [id number] sition: PACU - hemodynamically stable.   Maternal Condition: stable   Baby condition / location:  NICU  Attending Attestation: I was present and scrubbed for the entire procedure.   Signed: Surgeon(s): James G Arnold, MD 05/21/2014 7:11 PM

## 2014-05-21 NOTE — Anesthesia Preprocedure Evaluation (Signed)
Anesthesia Evaluation  Patient identified by MRN, date of birth, ID band Patient awake    Reviewed: Allergy & Precautions, H&P , Patient's Chart, lab work & pertinent test results  Airway Mallampati: II TM Distance: >3 FB Neck ROM: full    Dental no notable dental hx.    Pulmonary Current Smoker,  breath sounds clear to auscultation  Pulmonary exam normal       Cardiovascular Exercise Tolerance: Good Rhythm:regular Rate:Normal     Neuro/Psych    GI/Hepatic GERD-  Controlled,  Endo/Other    Renal/GU      Musculoskeletal   Abdominal   Peds  Hematology  (+) anemia ,   Anesthesia Other Findings   Reproductive/Obstetrics                           Anesthesia Physical Anesthesia Plan  ASA: II  Anesthesia Plan: Spinal   Post-op Pain Management:    Induction:   Airway Management Planned:   Additional Equipment:   Intra-op Plan:   Post-operative Plan:   Informed Consent: I have reviewed the patients History and Physical, chart, labs and discussed the procedure including the risks, benefits and alternatives for the proposed anesthesia with the patient or authorized representative who has indicated his/her understanding and acceptance.   Dental Advisory Given  Plan Discussed with: CRNA  Anesthesia Plan Comments: (Lab work confirmed with CRNA in room. Platelets okay. Discussed spinal anesthetic, and patient consents to the procedure:  included risk of possible headache,backache, failed block, allergic reaction, and nerve injury. This patient was asked if she had any questions or concerns before the procedure started. )        Anesthesia Quick Evaluation

## 2014-05-21 NOTE — Transfer of Care (Signed)
Immediate Anesthesia Transfer of Care Note  Patient: Rachel Meadows  Procedure(s) Performed: Procedure(s): CESAREAN SECTION (N/A)  Patient Location: PACU  Anesthesia Type:Spinal  Level of Consciousness: awake, alert  and oriented  Airway & Oxygen Therapy: Patient Spontanous Breathing  Post-op Assessment: Report given to PACU RN and Post -op Vital signs reviewed and stable  Post vital signs: Reviewed and stable  Complications: No apparent anesthesia complications

## 2014-05-21 NOTE — Plan of Care (Signed)
Problem: Consults Goal: Birthing Suites Patient Information Press F2 to bring up selections list  Outcome: Completed/Met Date Met:  05/21/14  Pt < [redacted] weeks EGA, Antenatal Patient (< 37 weeks) and Infection Goal: Air traffic controller notified_notified at safety rounds an at 1315 on 9/19 plan for c/section  Problem: Phase I Progression Outcomes Goal: Tolerating diet NPO Goal: Induction meds as ordered Outcome: Not Applicable Date Met:  41/32/44 BREECH-planned c/section

## 2014-05-21 NOTE — Progress Notes (Signed)
ANTIBIOTIC CONSULT NOTE - INITIAL  Pharmacy Consult for Gentamicin Indication: Chorioamnionitis  No Known Allergies  Patient Measurements: Height: 5' (152.4 cm) Weight: 167 lb 9.6 oz (76.023 kg) IBW/kg (Calculated) : 45.5 Adjusted Body Weight: 54.6 kg  Vital Signs: Temp: 98.4 F (36.9 C) (09/19 1107) Temp src: Oral (09/19 1107) BP: 133/70 mmHg (09/19 1158) Pulse Rate: 99 (09/19 1158)  Labs:  Recent Labs  05/19/14 1953  WBC 21.4*  HGB 10.5*  PLT 239    Medications:  Ampicillin 2g IV q6h  Assessment: 22 y.o. female G3P2002 at [redacted]w[redacted]d being initiated on ampicillin and gentamicin for chorio.  Estimated Ke = 0.271, Vd = 21.8 L   Goal of Therapy:  Gentamicin peak 6-8 mg/L and Trough < 1 mg/L  Plan:  Gentamicin 150 mg IV x 1  Gentamicin 130 mg IV every 8 hrs  Check Scr with next labs if gentamicin continued. Will check gentamicin levels if continued > 72hr or clinically indicated.  Nakayla Rorabaugh [place] 05/21/2014,12:17 PM

## 2014-05-21 NOTE — Progress Notes (Signed)
Report given to [facility] OR Nurse to assume care and transfer pt.

## 2014-05-21 NOTE — Progress Notes (Signed)
Patient ID: Rachel Meadows, female   DOB: 03/25/1991, 23 y.o.   MRN: [id number] ACULTY PRACTICE ANTEPARTUM COMPREHENSIVE PROGRESS NOTE  Rachel Meadows is a 23 y.o. G3P2002 at [redacted]w[redacted]d  who is admitted for PROM.   Fetal presentation is cephalic. Length of Stay:  22  Days  Subjective: Pt reports that she has persistent green discharge.  She reports contractions and abdominal tenderness which is worst with contractions  Patient reports good fetal movement.  She reports occ uterine contractions, no bleeding but, continued no loss of fluid per vagina.  Vitals:  Blood pressure 105/67, pulse 88, temperature 97.8 F (36.6 C), temperature source Oral, resp. rate 18, height 5' (1.524 m), weight 167 lb 9.6 oz (76.023 kg), SpO2 98.00%. Physical Examination: General appearance - alert, well appearing, and in no distress Abdomen - soft, nontender, nondistended, no masses or organomegaly Gravid.  Having a mildly palpable contractions   Extremities - peripheral pulses normal, no pedal edema, no clubbing or cyanosis Cervical Exam: Not evaluated. Membranes:ruptured  Fetal Monitoring:  Baseline: 140 bpm, Variability: Good {> 6 bpm), Accelerations: Reactive and TOCO: reg ctx q3-5 minutes  Labs:  Results for orders placed during the hospital encounter of 04/29/14 (from the past 24 hour(s))  WET PREP, GENITAL   Collection Time    05/20/14  1:22 PM      Result Value Ref Range   Yeast Wet Prep HPF POC NONE SEEN  NONE SEEN   Trich, Wet Prep NONE SEEN  NONE SEEN   Clue Cells Wet Prep HPF POC NONE SEEN  NONE SEEN   WBC, Wet Prep HPF POC MODERATE (*) NONE SEEN    Imaging Studies:    None recent   Medications:  Scheduled . docusate sodium  100 mg Oral Daily  . prenatal multivitamin  1 tablet Oral Q1200  . sodium chloride  3 mL Intravenous Q8H   I have reviewed the patient's current medications.  ASSESSMENT: Patient Active Problem List   Diagnosis Date Noted  . Chlamydia infection affecting pregnancy in  second trimester, antepartum 04/30/2014  . Preterm premature rupture of membranes in third trimester 04/29/2014  . Marijuana use 01/04/2014  . Supervision of other normal pregnancy 01/03/2014  . Smoker 01/03/2014    PLAN: Pt with regular contractions and abdominal tenderness concerning for chorioamnionitis Bedside ultrasound confirms infant in cephalic presentation Will transfer patient to L&D for IOL secondary to chorioamnionitis Continue routine antenatal care.   Rachel Meadows 05/21/2014,7:12 AM

## 2014-05-21 NOTE — Progress Notes (Addendum)
Patient ID: Rachel Meadows, female   DOB: 15-Feb-1991, 23 y.o.   MRN: [id number] Rachel Meadows is a 23 y.o. G3P2002 at [redacted]w[redacted]d admitted for [facility], suspected chorioamnionitis  Subjective: Sent from AN unit for IOL Comfortable. Uncomfortable with contractions. Upper abd is "sore." No chills, malaise. Ate solids at 0930  Objective: BP 118/62  Pulse 99  Temp(Src) 98.4 F (36.9 C) (Oral)  Resp 18  Ht 5' (1.524 m)  Wt 76.023 kg (167 lb 9.6 oz)  BMI 32.73 kg/m2  SpO2 98%  Fetal Heart FHR: 150 bpm, variability: moderate,  accelerations:  Present,  decelerations:  Absent   Contractions: irreg, mild  SVE:   Dilation: 4 Effacement (%): 80 Station: -2 Exam by:: DPOE,CNM Breech by exam confirmed by bedside US Assessment / Plan:  Consulted Dr. Debroah Loop for malpresentation> saw pt and counseled on indication and risks of C/S  NPO Start Amp/Gent  Labor: n/a Fetal Wellbeing: Cat 1> Continue continuous EFM Pain Control:  n/a Expected mode of delivery: LTCS  [name],[name] 05/21/2014, 11:59 AM   Agree with the above note, patient was counseled about indication for cesarean section and the risk of anesthesia, bleeding, infection, visceral organ damage, pain, aspiration, transfusion and her questions were answered. Will plan to do surgery 8 hr after she last ate unless indication arises to do sooner.   Adam Phenix, MD

## 2014-05-22 ENCOUNTER — Encounter (HOSPITAL_COMMUNITY): Payer: Self-pay | Admitting: *Deleted

## 2014-05-22 LAB — CBC
HCT: 24.7 % — ABNORMAL LOW (ref 36.0–46.0)
HEMOGLOBIN: 8.4 g/dL — AB (ref 12.0–15.0)
MCH: 31.5 pg (ref 26.0–34.0)
MCHC: 34 g/dL (ref 30.0–36.0)
MCV: 92.5 fL (ref 78.0–100.0)
Platelets: 217 10*3/uL (ref 150–400)
RBC: 2.67 MIL/uL — ABNORMAL LOW (ref 3.87–5.11)
RDW: 14 % (ref 11.5–15.5)
WBC: 9.2 10*3/uL (ref 4.0–10.5)

## 2014-05-22 MED ORDER — OXYCODONE HCL 5 MG PO TABS
5.0000 mg | ORAL_TABLET | Freq: Once | ORAL | Status: AC
  Administered 2014-05-22: 5 mg via ORAL
  Filled 2014-05-22: qty 1

## 2014-05-22 NOTE — Plan of Care (Signed)
Problem: Phase I Progression Outcomes Goal: Foley catheter patent Outcome: Completed/Met Date Met:  05/22/14 Patent draining clear amber urine qs amount. Goal: OOB as tolerated unless otherwise ordered Outcome: Completed/Met Date Met:  05/22/14 Up in wheelchair and went down to NICU.Tolerated very well. Goal: IS, TCDB as ordered Outcome: Completed/Met Date Met:  05/22/14 Can get I/S up to 2000.Cough is non productive. Goal: Initial discharge plan identified Outcome: Completed/Met Date Met:  05/22/14 VSS Pain controlled on po medication. Able to do self care Understands when a call to the MD is needed. Understands f/u care

## 2014-05-22 NOTE — Progress Notes (Signed)
Subjective: Postpartum Day 1: Cesarean Delivery Patient reports incisional pain and tolerating PO.    Objective: Vital signs in last 24 hours: Temp:  [97.7 F (36.5 C)-98.4 F (36.9 C)] 98.1 F (36.7 C) (09/20 0556) Pulse Rate:  [55-101] 55 (09/20 0556) Resp:  [16-22] 16 (09/20 0556) BP: (90-133)/(41-78) 98/55 mmHg (09/20 0556) SpO2:  [95 %-99 %] 95 % (09/20 0556)  Physical Exam:  General: alert and no distress Lochia: appropriate Uterine Fundus: firm Incision: healing well, no significant drainage DVT Evaluation: No evidence of DVT seen on physical exam.   Recent Labs  05/21/14 1530 05/22/14 0521  HGB 10.4* 8.4*  HCT 30.6* 24.7*    Assessment/Plan: Status post Cesarean section. Doing well postoperatively. Having a lot of pain. Has not taken any Percocet  Continue current care  Order for round the clock Tylenol discontinued (was ordered RTC Tylenol and ibuprofen, so could not take Percocet due to the acetaminophen dosage). Will give a single dose of Oxycodone IR then have patient start using Percocet PRN pain.  [facility] 05/22/2014, 6:46 AM

## 2014-05-22 NOTE — Lactation Note (Signed)
This note was copied from the chart of Rachel Meadows. Lactation Consultation Note  Patient Name: Rachel Meadows [id number] Date: 05/22/2014 Reason for consult: Initial assessment;NICU baby Mom in the process of pumping both breast with a DEBP,  Using the #24 Flange and per mom comfortable. Mom pumped for 20 mins  With 62 ml EBM yield. Milk is in , but not engorged. LC reviewed engorgement prevention and tx. Praised mom for her efforts. WIC referral form Faxed for a DEBP.     Maternal Data Formula Feeding for Exclusion: No Has patient been taught Hand Expression?: Yes  Feeding Feeding Type: Breast Milk with Formula added Length of feed: 5 min  LATCH Score/Interventions                      Lactation Tools Discussed/Used Tools: Pump Breast pump type: Double-Electric Breast Pump WIC Program: Yes (per mom talked to [facility] this past Friday ) Pump Review:  (already set up )   Consult Status Consult Status: Follow-up Date: 05/23/14 Follow-up type: In-patient    Kathrin Greathouse 05/22/2014, 2:39 PM

## 2014-05-22 NOTE — Anesthesia Postprocedure Evaluation (Signed)
  Anesthesia Post-op Note  Patient: Rachel Meadows  Procedure(s) Performed: Procedure(s): CESAREAN SECTION (N/A)  Patient Location: Women's Unit  Anesthesia Type:Spinal  Level of Consciousness: awake, alert  and oriented  Airway and Oxygen Therapy: Patient Spontanous Breathing  Post-op Pain: none  Post-op Assessment: Post-op Vital signs reviewed, Patient's Cardiovascular Status Stable, Respiratory Function Stable, No headache, No backache, No residual numbness and No residual motor weakness  Post-op Vital Signs: Reviewed and stable  Last Vitals:  Filed Vitals:   05/22/14 0556  BP: 98/55  Pulse: 55  Temp: 36.7 C  Resp: 16    Complications: No apparent anesthesia complications

## 2014-05-22 NOTE — Addendum Note (Signed)
Addendum created 05/22/14 1610 by Shanon Payor, CRNA   Modules edited: Notes Section   Notes Section:  File: [id number]

## 2014-05-23 ENCOUNTER — Encounter (HOSPITAL_COMMUNITY): Payer: Self-pay | Admitting: Obstetrics & Gynecology

## 2014-05-23 MED ORDER — DOCUSATE SODIUM 100 MG PO CAPS: 100.0000 mg | ORAL_CAPSULE | Freq: Two times a day (BID) | ORAL | Status: DC | PRN

## 2014-05-23 MED ORDER — FERROUS SULFATE 325 (65 FE) MG PO TABS: 325.0000 mg | ORAL_TABLET | Freq: Two times a day (BID) | ORAL | Status: DC

## 2014-05-23 MED ORDER — IBUPROFEN 600 MG PO TABS: 600.0000 mg | ORAL_TABLET | Freq: Four times a day (QID) | ORAL | Status: DC | PRN

## 2014-05-23 MED ORDER — OXYCODONE-ACETAMINOPHEN 5-325 MG PO TABS: 1.0000 | ORAL_TABLET | ORAL | Status: DC | PRN

## 2014-05-23 NOTE — Discharge Summary (Signed)
Obstetric Discharge Summary Reason for Admission: rupture of membranes at 28 weeks Prenatal Procedures: none Intrapartum Procedures: cesarean: low cervical, transverse at 31.6weeks Postpartum Procedures: none Complications-Operative and Postpartum: none Hemoglobin  Date Value Ref Range Status  05/22/2014 8.4* 12.0 - 15.0 g/dL Final     DELTA CHECK NOTED     REPEATED TO VERIFY     HCT  Date Value Ref Range Status  05/22/2014 24.7* 36.0 - 46.0 % Final    Physical Exam:  General: alert, cooperative and no distress Lochia: appropriate Uterine Fundus: firm, NT Incision: dressing clean dry and intact DVT Evaluation: Negative Homan's sign. No cords or calf tenderness.  Discharge Diagnoses: PPROM s/p cesarean delivery secondary to fetal malpresentation in the setting of chorioamnionitis  Discharge Information: Date: 05/23/2014 Activity: unrestricted and pelvic rest Diet: routine Medications: Ibuprofen, Colace, Iron and Percocet Condition: stable Instructions: refer to practice specific booklet Discharge to: home Patient planning to use Nexplanon for contraception Follow-up Information   Follow up with [facility]. (Contact office to be seen for a postpartum visit in 4 weeks)    Specialty:  Obstetrics and Gynecology   Contact information:   [address] [place] [address] [phone]      Newborn Data: Live born female  Birth Weight: 4 lb 6.2 oz (1990 g) APGAR: 6, 9  To stay in NICU.  Anjana Cheek 05/23/2014, 7:53 AM

## 2014-05-23 NOTE — Progress Notes (Signed)
Discharge instructions provided to patient at bedside.  Activity, medications, incision care, follow up appointments, when to call the doctor and community resources discussed. No questions at this time.  Patient left unit in stable condition with all personal belongings and prescriptions accompanied by Clinical research associate.  Osvaldo Angst, RN------------------

## 2014-05-23 NOTE — Discharge Instructions (Signed)

## 2014-05-23 NOTE — Plan of Care (Signed)
Problem: Phase II Progression Outcomes Goal: Other Phase II Outcomes/Goals Outcome: Completed/Met Date Met:  05/23/14 Voids qs amounts of clear amber urine

## 2014-05-23 NOTE — Plan of Care (Signed)
Problem: Phase I Progression Outcomes Goal: Pain controlled with appropriate interventions Outcome: Completed/Met Date Met:  05/23/14 Good pain control on po Motrin and Percocet. Goal: Voiding adequately Outcome: Completed/Met Date Met:  05/23/14 Has voided several times large amounts of amber urine. Goal: VS, stable, temp < 100.4 degrees F Outcome: Completed/Met Date Met:  05/23/14 VSS at this time. Goal: Other Phase I Outcomes/Goals Outcome: Completed/Met Date Met:  05/23/14 Had BM and passed flatus.  Problem: Phase II Progression Outcomes Goal: Pain controlled on oral analgesia Outcome: Completed/Met Date Met:  05/23/14 Good pain control on po Motrin and Percocet. Goal: Progress activity as tolerated unless otherwise ordered Outcome: Completed/Met Date Met:  05/23/14 Walks to NICU and tolerates well. Goal: Incision intact & without signs/symptoms of infection Outcome: Completed/Met Date Met:  05/23/14 Honeycomb remains in place and is dry and intact. Goal: Tolerating diet Outcome: Completed/Met Date Met:  05/23/14 Tolerates a Regular diet well.

## 2014-05-23 NOTE — Progress Notes (Signed)
Ur chart review completed.  

## 2014-05-24 NOTE — Progress Notes (Signed)
Late Entry: CSW was made aware this morning 05/20/2014, from attending RN of another verbal confrontation that had occurred on 05/19/14 between patient and FOB around noon. The conflict occurred a matter of hours before CSW's follow up on patient's well-being around 4pm that same day. Per RN's report, the details of the conflict first started with FOB loudly yelling at patient about money. FOB reported that he had $100 and somehow $40 had dropped out of his pocket. Patient had a debit card with $300, setting aside $220 for her baby, and the rest apparently belonged to FOB and he wanted it immediately. Per RN's report, the FOB was loudly cussing at patient, saying things like, "You're so lazy. You're f#_0  fat. Give me back my money. I'm working so hard for you and you're not doing anything, you stupid b%@*h." RN reported that patient got up from bed and went to the ATM to withdrawal his $80 dollars. RN indicated she had asked patient if security needed to be involved, and patient declined. Patient reported to CSW at 4pm on 05/19/2014 that she was not experiencing any types of stressors, issues, or problems. Patient did not inform CSW of this argument RN is referring to. CSW inquired on 05/20/2014 about going into patient's room to discuss the issue, but RN stated that patient and FOB were sleeping in the same hospital bed and it would not be a good time.  CSW entered room on 05/21/14 and patient and FOB were sharing hospital bed. Patient was groggy and waking up from nap. FOB was sleeping restlessly in small bed-space beside patient. CSW had very quiet conversation with patient, in which for the first time, she seemed to be emotional, and she looked exhausted. She informed CSW that she had been contracting for most of the night, but now they had subsided, but she is dealing with physical pain. CSW quietly stated concern about patients stress level, per RN's report of witnessing now two verbally aggressive arguments.  CSW asked about FOB's work schedule, and if he was still living at the hotel. Patient reported that FOB is living here in the hospital with her for the time being and that he works 12am-8am.  CSW asked patient about taking alone time and that might help with her stress level, because it is normal to need space, especially being on bed-rest in [facility] for the past three weeks. Patient stated that she doesn't want to be alone in the hospital, and asked FOB to be here with her at all times. She states she does not know many dads who would be willing to do that. Patient added, "I don't know of anyone who would want to be in the hospital alone. As a kid, I was in the hospital a lot, so I know the hospital routine." CSW asked about a previous goal that patient had shared about saving money for a 3-bedroom house so all of her children can live together with her. Since FOB is living in the hospital there are no living expenses at this time, and CSW asked how much she thinks they have approximately saved thus far. Patient checked to see if FOB was sleeping soundly, and silently mouthed to CSW that, "I have my own money saved up." Patient indicated that FOB is unaware of this.  CSW offered support and encouragement in discussing the strengths of patient. CSW emphasized how well she appears to be coping emotionally, given her situation, and praised her for this.  CSW reminded her that it  is okay to allow herself to be emotional at times, since this is not what she expected or planned. Patient responded, "This isn't a good time to cry." CSW again encouraged her not to repress her emotions and gave her permission to be emotional. Patient seemed more receptive than in past conversations. CSW asked about past coping mechanisms, like journaling for example. Patient identified that watching TV works for her. Patient also stated that she asked the RN to turn on the fetal heartbeat machine so she can hear and be reassured that  baby still has a heartbeat. CSW praised patient for identifying positive coping mechanisms.

## 2014-05-26 ENCOUNTER — Telehealth: Payer: Self-pay | Admitting: General Practice

## 2014-05-26 NOTE — Telephone Encounter (Signed)
Patient called and left message stating she wants a refill on her prescription. Left callback number of [phone] ext [phone]

## 2014-05-27 NOTE — Telephone Encounter (Signed)
Called Rachel Meadows at number she left which is where she is staying- [address]- was informed she did not answer, not able to leave message.  Called home number and it appears to be a nonworking number.

## 2014-05-27 NOTE — Telephone Encounter (Signed)
Pt left another message @ 1031 today requesting refill of medication.  She can be reached @ [phone] ext [phone]

## 2014-05-30 ENCOUNTER — Telehealth: Payer: Self-pay

## 2014-05-30 NOTE — Telephone Encounter (Signed)
Called and spoke w/pt. I advised her of Dr. Deretha Emory recommendation to contact [facility] office for any of her medical questions/needs. Pt voiced understanding.

## 2014-05-30 NOTE — Telephone Encounter (Signed)
Patient returned call and stated she is experiencing pain where stitches are and it is not going away-- requests refill percocet. States she is taking 2  tabs ibuprofen at a time without relief. Informed patient I would discuss request with doctor and call her back. Patient states she can be reached at [phone] ext: [phone]. Consulted Dr. Jolayne Panther who states patient should follow up with provider at [facility], where she was seen for all prenatal care. Attempted to call patient. No answer. Unable to leave message.

## 2014-05-30 NOTE — Telephone Encounter (Signed)
Patient called requesting a call back. Did not specify reason for call. Stated she can be reached at [phone] [facility] [place]). Attempted to call patient. Woman picked up and stated patient is at [facility] hospital in the NICU. Informed her I was returning patient's call, if she still has any questions or concerns she can call clinic. Woman stated she would pass message to patient.

## 2014-06-08 ENCOUNTER — Encounter: Payer: Self-pay | Admitting: Advanced Practice Midwife

## 2014-06-08 ENCOUNTER — Ambulatory Visit (INDEPENDENT_AMBULATORY_CARE_PROVIDER_SITE_OTHER): Payer: Medicaid Other | Admitting: Advanced Practice Midwife

## 2014-06-08 VITALS — BP 130/90 | Wt 153.0 lb

## 2014-06-08 DIAGNOSIS — Z9889 Other specified postprocedural states: Secondary | ICD-10-CM

## 2014-06-08 MED ORDER — NAPROXEN 500 MG PO TABS: 500.0000 mg | ORAL_TABLET | Freq: Two times a day (BID) | ORAL | Status: DC

## 2014-06-08 NOTE — Progress Notes (Signed)
[facility] Visit  Patient name: Rachel Meadows MRN [id number]  Date of birth: 12/07/1990  CC & HPI:  Rachel Meadows is a 23 y.o. Caucasian female presenting today for C/o incisional drainage from CSection site from 9/19.  No odor or tenderness.  Pertinent History Reviewed:  Medical & Surgical Hx:   Past Medical History  Diagnosis Date  . Depression     panic attacks  . Anxiety   . Hearing loss   . GERD (gastroesophageal reflux disease)   . Kidney stones   . Pregnant    Past Surgical History  Procedure Laterality Date  . Cholecystectomy    . Anoidectomy      adenoidectomy  . Tonsillectomy    . External ear surgery Bilateral   . Cesarean section N/A 05/21/2014    Procedure: CESAREAN SECTION;  Surgeon: Adam PhenixJames G Arnold, MD;  Location: [facility];  Service: Obstetrics;  Laterality: N/A;   Medications: Reviewed & Updated - see associated section Social History: Reviewed -  reports that she has been smoking Cigarettes.  She has a 1.25 pack-year smoking history. She has never used smokeless tobacco.  Objective Findings:  Vitals: BP 130/90  Wt 153 lb (69.4 kg)  Physical Examination: General appearance - alert, well appearing, and in no distress Mental status - alert, oriented to person, place, and time Abdomen - Incision without any erythema at all, no odor.  About 4cm area of weepy extra granulation tissue--treated with silver nitrate  No results found for this or any previous visit (from the past 24 hour(s)).   Assessment & Plan:  A:   Post op c section, no evidence of infection P:  Plans nexplanon: ordered   F/U 3 for nexplanon   CRESENZO-DISHMAN,Rhyann Berton CNM 06/08/2014 1:41 PM

## 2014-06-09 ENCOUNTER — Ambulatory Visit: Payer: Self-pay

## 2014-06-09 NOTE — Lactation Note (Signed)
This note was copied from the chart of Rachel Jacqulyn LinerKara Kussman. Lactation Consultation Note  Follow up visit with mom at baby's bedside in NICU.  Mom has questions about milk flow and milk supply.  She states milk flow stops after 7 minutes.  Mom was pumping 60 mls per breast and now 30 mls.  Instructed mom to continue pumping the full 15 minutes and try using the letdown button when milk slows or stops.  Encouraged to call for concerns.  Patient Name: Rachel Meadows's Date: 06/09/2014     Maternal Data    Feeding Feeding Type: Bottle Fed - Breast Milk Nipple Type: Slow - flow Length of feed: 20 min  LATCH Score/Interventions                      Lactation Tools Discussed/Used     Consult Status      Huston FoleyMOULDEN, [name] 06/09/2014, 2:10 PM

## 2014-06-16 ENCOUNTER — Emergency Department (HOSPITAL_COMMUNITY)
Admission: EM | Admit: 2014-06-16 | Discharge: 2014-06-16 | Disposition: A | Discharge status: Home / Self Care | Payer: Medicaid Other | Attending: Emergency Medicine | Admitting: Emergency Medicine

## 2014-06-16 ENCOUNTER — Emergency Department (HOSPITAL_COMMUNITY): Payer: Medicaid Other

## 2014-06-16 ENCOUNTER — Encounter (HOSPITAL_COMMUNITY): Payer: Self-pay | Admitting: Emergency Medicine

## 2014-06-16 DIAGNOSIS — Z79899 Other long term (current) drug therapy: Secondary | ICD-10-CM | POA: Diagnosis not present

## 2014-06-16 DIAGNOSIS — B9789 Other viral agents as the cause of diseases classified elsewhere: Secondary | ICD-10-CM | POA: Insufficient documentation

## 2014-06-16 DIAGNOSIS — O860 Infection of obstetric surgical wound: Secondary | ICD-10-CM | POA: Diagnosis present

## 2014-06-16 DIAGNOSIS — Z9049 Acquired absence of other specified parts of digestive tract: Secondary | ICD-10-CM | POA: Diagnosis not present

## 2014-06-16 DIAGNOSIS — H9190 Unspecified hearing loss, unspecified ear: Secondary | ICD-10-CM | POA: Diagnosis not present

## 2014-06-16 DIAGNOSIS — Z8659 Personal history of other mental and behavioral disorders: Secondary | ICD-10-CM | POA: Diagnosis not present

## 2014-06-16 DIAGNOSIS — Z720 Tobacco use: Secondary | ICD-10-CM | POA: Diagnosis not present

## 2014-06-16 DIAGNOSIS — Z3202 Encounter for pregnancy test, result negative: Secondary | ICD-10-CM | POA: Insufficient documentation

## 2014-06-16 DIAGNOSIS — Z87442 Personal history of urinary calculi: Secondary | ICD-10-CM | POA: Diagnosis not present

## 2014-06-16 DIAGNOSIS — Z8719 Personal history of other diseases of the digestive system: Secondary | ICD-10-CM | POA: Insufficient documentation

## 2014-06-16 DIAGNOSIS — Y838 Other surgical procedures as the cause of abnormal reaction of the patient, or of later complication, without mention of misadventure at the time of the procedure: Secondary | ICD-10-CM | POA: Insufficient documentation

## 2014-06-16 DIAGNOSIS — L089 Local infection of the skin and subcutaneous tissue, unspecified: Secondary | ICD-10-CM | POA: Insufficient documentation

## 2014-06-16 LAB — COMPREHENSIVE METABOLIC PANEL
ALT: 9 U/L (ref 0–35)
ANION GAP: 14 (ref 5–15)
AST: 11 U/L (ref 0–37)
Albumin: 3.2 g/dL — ABNORMAL LOW (ref 3.5–5.2)
Alkaline Phosphatase: 92 U/L (ref 39–117)
BUN: 7 mg/dL (ref 6–23)
CO2: 26 mEq/L (ref 19–32)
CREATININE: 0.54 mg/dL (ref 0.50–1.10)
Calcium: 8.8 mg/dL (ref 8.4–10.5)
Chloride: 103 mEq/L (ref 96–112)
GFR calc non Af Amer: >90 mL/min (ref >90)
GLUCOSE: 80 mg/dL (ref 70–99)
POTASSIUM: 3.5 meq/L — AB (ref 3.7–5.3)
Sodium: 143 mEq/L (ref 137–147)
TOTAL PROTEIN: 7.4 g/dL (ref 6.0–8.3)

## 2014-06-16 LAB — URINALYSIS, ROUTINE W REFLEX MICROSCOPIC
Bilirubin Urine: NEGATIVE
GLUCOSE, UA: NEGATIVE mg/dL
KETONES UR: NEGATIVE mg/dL
Nitrite: NEGATIVE
PROTEIN: NEGATIVE mg/dL
Specific Gravity, Urine: 1.017 (ref 1.005–1.030)
Urobilinogen, UA: 0.2 mg/dL (ref 0.0–1.0)
pH: 6 (ref 5.0–8.0)

## 2014-06-16 LAB — CBC WITH DIFFERENTIAL/PLATELET
BASOS ABS: 0 10*3/uL (ref 0.0–0.1)
BASOS PCT: 0 % (ref 0–1)
Eosinophils Absolute: 0.5 10*3/uL (ref 0.0–0.7)
Eosinophils Relative: 5 % (ref 0–5)
HEMATOCRIT: 33.4 % — AB (ref 36.0–46.0)
Hemoglobin: 11.2 g/dL — ABNORMAL LOW (ref 12.0–15.0)
Lymphocytes Relative: 26 % (ref 12–46)
Lymphs Abs: 2.8 10*3/uL (ref 0.7–4.0)
MCH: 30.1 pg (ref 26.0–34.0)
MCHC: 33.5 g/dL (ref 30.0–36.0)
MCV: 89.8 fL (ref 78.0–100.0)
MONO ABS: 0.9 10*3/uL (ref 0.1–1.0)
Monocytes Relative: 8 % (ref 3–12)
NEUTROS PCT: 61 % (ref 43–77)
Neutro Abs: 6.4 10*3/uL (ref 1.7–7.7)
Platelets: 291 10*3/uL (ref 150–400)
RBC: 3.72 MIL/uL — ABNORMAL LOW (ref 3.87–5.11)
RDW: 12.9 % (ref 11.5–15.5)
WBC: 10.6 10*3/uL — ABNORMAL HIGH (ref 4.0–10.5)

## 2014-06-16 LAB — URINE MICROSCOPIC-ADD ON

## 2014-06-16 LAB — I-STAT CHEM 8, ED
BUN: 5 mg/dL — ABNORMAL LOW (ref 6–23)
Calcium, Ion: 1.13 mmol/L (ref 1.12–1.23)
Chloride: 103 mEq/L (ref 96–112)
Creatinine, Ser: 0.6 mg/dL (ref 0.50–1.10)
GLUCOSE: 86 mg/dL (ref 70–99)
HEMATOCRIT: 35 % — AB (ref 36.0–46.0)
Hemoglobin: 11.9 g/dL — ABNORMAL LOW (ref 12.0–15.0)
Potassium: 3.2 mEq/L — ABNORMAL LOW (ref 3.7–5.3)
Sodium: 140 mEq/L (ref 137–147)
TCO2: 24 mmol/L (ref 0–100)

## 2014-06-16 LAB — LIPASE, BLOOD: Lipase: 28 U/L (ref 11–59)

## 2014-06-16 LAB — PREGNANCY, URINE: Preg Test, Ur: NEGATIVE

## 2014-06-16 LAB — I-STAT CG4 LACTIC ACID, ED: Lactic Acid, Venous: 0.56 mmol/L (ref 0.5–2.2)

## 2014-06-16 MED ORDER — DOXYCYCLINE HYCLATE 100 MG PO CAPS: 100.0000 mg | ORAL_CAPSULE | Freq: Two times a day (BID) | ORAL | Status: DC

## 2014-06-16 MED ORDER — SODIUM CHLORIDE 0.9 % IV BOLUS (SEPSIS)
1000.0000 mL | Freq: Once | INTRAVENOUS | Status: AC
  Administered 2014-06-16: 1000 mL via INTRAVENOUS

## 2014-06-16 MED ORDER — DOXYCYCLINE HYCLATE 100 MG PO TABS
100.0000 mg | ORAL_TABLET | Freq: Once | ORAL | Status: AC
  Administered 2014-06-16: 100 mg via ORAL
  Filled 2014-06-16: qty 1

## 2014-06-16 MED ORDER — CEPHALEXIN 250 MG PO CAPS
500.0000 mg | ORAL_CAPSULE | Freq: Once | ORAL | Status: AC
  Administered 2014-06-16: 500 mg via ORAL
  Filled 2014-06-16: qty 2

## 2014-06-16 MED ORDER — CEPHALEXIN 500 MG PO CAPS: 500.0000 mg | ORAL_CAPSULE | Freq: Two times a day (BID) | ORAL | Status: DC

## 2014-06-16 MED ORDER — IOHEXOL 300 MG/ML  SOLN
25.0000 mL | Freq: Once | INTRAMUSCULAR | Status: AC | PRN
  Administered 2014-06-16: 25 mL via ORAL

## 2014-06-16 MED ORDER — MORPHINE SULFATE 4 MG/ML IJ SOLN
4.0000 mg | Freq: Once | INTRAMUSCULAR | Status: AC
  Administered 2014-06-16: 4 mg via INTRAVENOUS
  Filled 2014-06-16: qty 1

## 2014-06-16 MED ORDER — IOHEXOL 300 MG/ML  SOLN
100.0000 mL | Freq: Once | INTRAMUSCULAR | Status: AC | PRN
  Administered 2014-06-16: 100 mL via INTRAVENOUS

## 2014-06-16 NOTE — ED Notes (Signed)
Notified CT that pt is ready for CT

## 2014-06-16 NOTE — Discharge Instructions (Signed)
Wound Infection Rachel Meadows, you were seen for a wound infection. Your given antibiotics, continue these antibiotics until you're seen by her OB/GYN physician.  You cannot breast-feed while taking these antibiotics.You have an appointment this Friday, October 16 at 9 AM. [facility]. If any of your symptoms worsen come back to the emergency department immediately for repeat evaluation.  Thank you. A wound infection happens when a type of germ (bacteria) grows in a wound. Caring for the infection can help the wound heal. Wound infections need treatment. HOME CARE   Only take medicine as told by your doctor.  Take your antibiotic medicine as told. Finish it even if you start to feel better.  Clean the wound with mild soap and water as told. Rinse the soap off. Pat the area dry with a clean towel. Do not rub the wound.  Change any bandages (dressings) as told by your doctor.  Put cream and a bandage on the wound as told by your doctor.  If the bandage sticks, wet it with soapy water to remove the bandage.  Change the bandage if it gets wet, dirty, or starts to smell.  Take showers. Do not take baths, swim, or do anything that puts your wound under water.  Avoid exercise that makes you sweat.  If your wound itches, use a medicine that helps stop itching. Do not pick or scratch at the wound.  Keep all doctor visits as told. GET HELP RIGHT AWAY IF:   You have more puffiness (swelling), pain, or redness around the wound.  You have more yellowish-white fluid (pus) coming from the wound.  You have a bad smell coming from the wound.  Your wound breaks open more.  You have a fever. MAKE SURE YOU:   Understand these instructions.  Will watch your condition.  Will get help right away if you are not doing well or get worse. Document Released: 05/28/2008 Document Revised: 11/11/2011 Document Reviewed: 01/28/2011 [facility] Patient Information 2015  [place] [place]. This information is not intended to replace advice given to you by your health care provider. Make sure you discuss any questions you have with your health care provider.

## 2014-06-16 NOTE — ED Notes (Signed)
Pt. reports pain and drainage at incision site of her c-section last 05/21/2014 onset last week , denies fever or chills.

## 2014-06-16 NOTE — ED Provider Notes (Signed)
CSN: [id number]     Arrival date & time 06/16/14  0057 History   First MD Initiated Contact with Patient 06/16/14 0154     Chief Complaint  Patient presents with  . Post-op Problem     (Consider location/radiation/quality/duration/timing/severity/associated sxs/prior Treatment) HPI Rachel Meadows is a 23 y.o. female with a past medical history of C-section on September 19 coming in with possible infection. Patient states for the last week she has had pain from her incision site. She states has been pus draining from there as well. This has progressively gotten worse. She denies any fevers or sweating. She did see her OB/GYN physician a week ago who did not believe it is infected at that time. Since then the patient states her symptoms have gotten worse. She denies any abdominal pain other than at the site, she has no nausea vomiting or diarrhea. She denies any changes in her urination. Nothing has made her symptoms better or worse. She has no chest pain or shortness of breath. Patient has no further complaints.  10 Systems reviewed and are negative for acute change except as noted in the HPI.     Past Medical History  Diagnosis Date  . Depression     panic attacks  . Anxiety   . Hearing loss   . GERD (gastroesophageal reflux disease)   . Kidney stones   . Pregnant    Past Surgical History  Procedure Laterality Date  . Cholecystectomy    . Anoidectomy      adenoidectomy  . Tonsillectomy    . External ear surgery Bilateral   . Cesarean section N/A 05/21/2014    Procedure: CESAREAN SECTION;  Surgeon: Adam Phenix, MD;  Location: [facility];  Service: Obstetrics;  Laterality: N/A;   Family History  Problem Relation Age of Onset  . Kidney failure Brother    History  Substance Use Topics  . Smoking status: Current Every Day Smoker -- 0.25 packs/day for 5 years    Types: Cigarettes  . Smokeless tobacco: Never Used  . Alcohol Use: No   OB History   Grav Para Term Preterm  Abortions TAB SAB Ect Mult Living   3 3 2 1      3      Review of Systems    Allergies  Review of patient's allergies indicates no known allergies.  Home Medications   Prior to Admission medications   Medication Sig Start Date End Date Taking? Authorizing Provider  ibuprofen (ADVIL,MOTRIN) 600 MG tablet Take 1 tablet (600 mg total) by mouth every 6 (six) hours as needed for mild pain. 05/23/14  Yes Peggy Constant, MD  Prenatal Vit-Fe Fumarate-FA (PRENATAL VITAMIN PO) Take 1 tablet by mouth daily.    Yes Historical Provider, MD   BP 106/66  Pulse 79  Temp(Src) 97.8 F (36.6 C) (Oral)  Resp 18  Ht 5' (1.524 m)  Wt 153 lb (69.4 kg)  BMI 29.88 kg/m2  SpO2 98% Physical Exam  Nursing note and vitals reviewed. Constitutional: She is oriented to person, place, and time. She appears well-developed and well-nourished. No distress.  HENT:  Head: Normocephalic and atraumatic.  Nose: Nose normal.  Mouth/Throat: Oropharynx is clear and moist. No oropharyngeal exudate.  Eyes: Conjunctivae and EOM are normal. Pupils are equal, round, and reactive to light. No scleral icterus.  Neck: Normal range of motion. Neck supple. No JVD present. No tracheal deviation present. No thyromegaly present.  Cardiovascular: Normal rate, regular rhythm and normal heart sounds.  Exam reveals no gallop and no friction rub.   No murmur heard. Pulmonary/Chest: Effort normal and breath sounds normal. No respiratory distress. She has no wheezes. She exhibits no tenderness.  Abdominal: Soft. Bowel sounds are normal. She exhibits no distension and no mass. There is no tenderness. There is no rebound and no guarding.  Musculoskeletal: Normal range of motion. She exhibits no edema and no tenderness.  Lymphadenopathy:    She has no cervical adenopathy.  Neurological: She is alert and oriented to person, place, and time. No cranial nerve deficit. She exhibits normal muscle tone.  Skin: Skin is warm and dry. No rash noted.  She is not diaphoretic. No erythema. No pallor.  Low transverse cesarean section is approximately 10 cm in length. The right 5 cm has purulent exudate coming from it. The entire wound is tender to palpation. There is no overlying cellulitis.    ED Course  Procedures (including critical care time) Labs Review Labs Reviewed  CBC WITH DIFFERENTIAL - Abnormal; Notable for the following:    WBC 10.6 (*)    RBC 3.72 (*)    Hemoglobin 11.2 (*)    HCT 33.4 (*)    All other components within normal limits  COMPREHENSIVE METABOLIC PANEL - Abnormal; Notable for the following:    Potassium 3.5 (*)    Albumin 3.2 (*)    Total Bilirubin <0.2 (*)    All other components within normal limits  URINALYSIS, ROUTINE W REFLEX MICROSCOPIC - Abnormal; Notable for the following:    APPearance CLOUDY (*)    Hgb urine dipstick SMALL (*)    Leukocytes, UA MODERATE (*)    All other components within normal limits  URINE MICROSCOPIC-ADD ON - Abnormal; Notable for the following:    Squamous Epithelial / LPF MANY (*)    All other components within normal limits  I-STAT CHEM 8, ED - Abnormal; Notable for the following:    Potassium 3.2 (*)    BUN 5 (*)    Hemoglobin 11.9 (*)    HCT 35.0 (*)    All other compo Riley L71 moEstNonda Lo(781DarlCornelius MoraKorKoreaeasAmanda P dominal wall closed incision correlating with the history. No undrained fluid collection identified. No evidence of  intra-abdominal abscess or fistula.  LOWER CHEST: Unremarkable.  ABDOMEN/PELVIS:  Liver: No focal abnormality.  Biliary: Cholecystectomy.  No biliary ductal enlargement.  Pancreas: Unremarkable.  Spleen: Unremarkable.  Adrenals: Unremarkable.  Kidneys and ureters: No hydronephrosis or stone.  Bladder: Unremarkable.  Reproductive: Normal residual enlargement of the uterus after recent delivery. Hypoenhancing defect in the lower anterior uterine segment is a normal finding after recent cesarean section. Dominant follicle in the left ovary. Small volume free pelvic fluid which is non loculated and likely physiologic.  Bowel: There is a circumferentially thickened appearance of the proximal colon without surrounding inflammatory change. No bowel obstruction. Negative appendix.  Retroperitoneum: No mass or adenopathy.  Vascular: No acute abnormality.  OSSEOUS: No acute abnormalities.  IMPRESSION: 1. No abdominal wall abscess or evidence of dehiscence. 2. Proximal colon thickening could reflect colitis if associated with diarrhea.   Electronically Signed   By: Tiburcio PeaJonathan  Watts M.D.   On: 06/16/2014 05:41     EKG Interpretation None      MDM   Final diagnoses:  None    Patient presents emergency department out of concern for a postop infection. Will investigate with CT scan of the abdomen.   CT scan does not show any abscess. Patient is several weeks out from her surgery however she does have purulence coming from her wound. I will give her doxycycline and Keflex for treatment. She states she is not breast-feeding. I spoke with Dr. Emelda FearFerguson who recommends the patient to followup this Friday, October 16 at 9 AM in clinic. Patient is amenable to this plan and states he keep the appointment. We'll give her a prescription for antibiotics to take until then.   Tomasita CrumbleAdeleke Gregoria Selvy, MD 06/16/14 [phone]

## 2014-06-17 ENCOUNTER — Ambulatory Visit: Payer: Medicaid Other | Admitting: Obstetrics and Gynecology

## 2014-06-17 ENCOUNTER — Encounter: Payer: Self-pay | Admitting: Obstetrics and Gynecology

## 2014-06-18 LAB — WOUND CULTURE

## 2014-06-20 ENCOUNTER — Telehealth (HOSPITAL_BASED_OUTPATIENT_CLINIC_OR_DEPARTMENT_OTHER): Payer: Self-pay

## 2014-06-20 NOTE — Telephone Encounter (Signed)
Post ED Visit - Positive Culture Follow-up  Culture report reviewed by antimicrobial stewardship pharmacist: []  Rachel Meadows, Pharm.D., BCPS [x]  Rachel Meadows, Pharm.D., BCPS []  Rachel Meadows, Pharm.D., BCPS []  [place]wood FallsMinh Meadows, [address] Rainbow BoulevardPharm.D., BCPS, AAHIVP []  Rachel Meadows, Pharm.D., BCPS, AAHIVP []  Rachel Meadows, Pharm.D. []  Rachel Meadows, Pharm.D.  Positive Wound culture Treated with Cephalexin & Doxycycline, organism sensitive to the same and no further patient follow-up is required at this time.  Rachel Meadows, Rachel Meadows 06/20/2014, 4:16 AM

## 2014-06-22 ENCOUNTER — Ambulatory Visit: Payer: Medicaid Other | Admitting: Obstetrics & Gynecology

## 2014-06-23 ENCOUNTER — Ambulatory Visit: Payer: Medicaid Other | Admitting: Advanced Practice Midwife

## 2014-06-27 ENCOUNTER — Ambulatory Visit: Payer: Medicaid Other | Admitting: Adult Health

## 2014-06-27 ENCOUNTER — Encounter: Payer: Self-pay | Admitting: *Deleted

## 2014-06-28 ENCOUNTER — Encounter (HOSPITAL_COMMUNITY): Payer: Self-pay | Admitting: Emergency Medicine

## 2014-06-28 ENCOUNTER — Emergency Department (HOSPITAL_COMMUNITY)
Admission: EM | Admit: 2014-06-28 | Discharge: 2014-06-28 | Discharge status: Against Medical Advice | Payer: Medicaid Other | Attending: Family Medicine | Admitting: Family Medicine

## 2014-06-28 ENCOUNTER — Encounter (HOSPITAL_COMMUNITY): Payer: Self-pay

## 2014-06-28 ENCOUNTER — Inpatient Hospital Stay (EMERGENCY_DEPARTMENT_HOSPITAL)
Admission: AD | Admit: 2014-06-28 | Discharge: 2014-06-29 | Disposition: A | Discharge status: Home / Self Care | Payer: Medicaid Other | Source: Ambulatory Visit | Attending: Family Medicine | Admitting: Family Medicine

## 2014-06-28 DIAGNOSIS — R109 Unspecified abdominal pain: Secondary | ICD-10-CM

## 2014-06-28 DIAGNOSIS — Z720 Tobacco use: Secondary | ICD-10-CM | POA: Insufficient documentation

## 2014-06-28 DIAGNOSIS — N200 Calculus of kidney: Secondary | ICD-10-CM

## 2014-06-28 NOTE — ED Notes (Signed)
Patient rolled to fast track by her friend. Went to the bathroom. Patient's friend came and got me to let me know that the patient was laying in the floor and "could not" get up and get back in wheelchair. Went and assisted patient out of the floor into a standing position, then she walked to the wheelchair.

## 2014-06-28 NOTE — MAU Provider Note (Signed)
History     CSN: [id number]  Arrival date and time: 06/28/14 2336   First Provider Initiated Contact with Patient 06/28/14 2351      No chief complaint on file.  HPI Ms. Rachel Meadows is a 23 y.o. [phone] who presents to MAU today with complaint of pain at her incision site. Patient had C/S on 05/21/14 for PPROM and breech presentation at 31 weeks. She was seen on 06/16/14 at [facility] for pain at her incision site and diagnosed with infection. She was given 2 antibiotics which she states that she completed. She states that this severe pain started about an hour prior to arrival. Patient presented to [facility] and then [facility] after triage because of the wait. She then called EMS and was brought to MAU. The patient denies fever.  OB History   Grav Para Term Preterm Abortions TAB SAB Ect Mult Living   3 3 2 1      3       Past Medical History  Diagnosis Date  . Depression     panic attacks  . Anxiety   . Hearing loss   . GERD (gastroesophageal reflux disease)   . Kidney stones   . Pregnant     Past Surgical History  Procedure Laterality Date  . Cholecystectomy    . Anoidectomy      adenoidectomy  . Tonsillectomy    . External ear surgery Bilateral   . Cesarean section N/A 05/21/2014    Procedure: CESAREAN SECTION;  Surgeon: Adam Phenix, MD;  Location: [facility] ORS;  Service: Obstetrics;  Laterality: N/A;    Family History  Problem Relation Age of Onset  . Kidney failure Brother     History  Substance Use Topics  . Smoking status: Current Every Day Smoker -- 0.25 packs/day for 5 years    Types: Cigarettes  . Smokeless tobacco: Never Used  . Alcohol Use: No    Allergies: No Known Allergies  Prescriptions prior to admission  Medication Sig Dispense Refill  . cephALEXin (KEFLEX) 500 MG capsule Take 1 capsule (500 mg total) by mouth 2 (two) times daily.  14 capsule  0  . doxycycline (VIBRAMYCIN) 100 MG capsule Take 1 capsule (100 mg total) by mouth 2 (two) times daily. One  po bid x 7 days  14 capsule  0  . ibuprofen (ADVIL,MOTRIN) 600 MG tablet Take 1 tablet (600 mg total) by mouth every 6 (six) hours as needed for mild pain.  30 tablet  0  . Prenatal Vit-Fe Fumarate-FA (PRENATAL VITAMIN PO) Take 1 tablet by mouth daily.         Review of Systems  Constitutional: Negative for fever.  Gastrointestinal: Positive for abdominal pain.   Physical Exam   Blood pressure 133/83, pulse 62, temperature 97.5 F (36.4 C), temperature source Oral, resp. rate 20, SpO2 100.00%, unknown if currently breastfeeding.  Physical Exam  Constitutional: She appears well-developed and well-nourished.  Appears very uncomfortable  HENT:  Head: Normocephalic.  Cardiovascular: Normal rate.   Respiratory: Effort normal.  GI: Soft.    Neurological: She is alert.  Skin: Skin is warm and dry. No erythema.  Psychiatric: She has a normal mood and affect.   Results for orders placed during the hospital encounter of 06/28/14 (from the past 24 hour(s))  CBC WITH DIFFERENTIAL     Status: Abnormal   Collection Time    06/29/14 12:30 AM      Result Value Ref Range   WBC  13.0 (*) 4.0 - 10.5 K/uL   RBC 3.56 (*) 3.87 - 5.11 MIL/uL   Hemoglobin 10.6 (*) 12.0 - 15.0 g/dL   HCT 16.131.8 (*) 09.636.0 - 04.546.0 %   MCV 89.3  78.0 - 100.0 fL   MCH 29.8  26.0 - 34.0 pg   MCHC 33.3  30.0 - 36.0 g/dL   RDW 40.913.3  81.111.5 - 91.415.5 %   Platelets 315  150 - 400 K/uL   Neutrophils Relative % 85 (*) 43 - 77 %   Neutro Abs 11.1 (*) 1.7 - 7.7 K/uL   Lymphocytes Relative 9 (*) 12 - 46 %   Lymphs Abs 1.2  0.7 - 4.0 K/uL   Monocytes Relative 5  3 - 12 %   Monocytes Absolute 0.6  0.1 - 1.0 K/uL   Eosinophils Relative 1  0 - 5 %   Eosinophils Absolute 0.1  0.0 - 0.7 K/uL   Basophils Relative 0  0 - 1 %   Basophils Absolute 0.0  0.0 - 0.1 K/uL  COMPREHENSIVE METABOLIC PANEL     Status: Abnormal   Collection Time    06/29/14 12:30 AM      Result Value Ref Range   Sodium 137  137 - 147 mEq/L   Potassium 3.4 (*)  3.7 - 5.3 mEq/L   Chloride 101  96 - 112 mEq/L   CO2 22  19 - 32 mEq/L   Glucose, Bld 107 (*) 70 - 99 mg/dL   BUN 10  6 - 23 mg/dL   Creatinine, Ser 7.820.62  0.50 - 1.10 mg/dL   Calcium 9.0  8.4 - 95.610.5 mg/dL   Total Protein 7.2  6.0 - 8.3 g/dL   Albumin 3.5  3.5 - 5.2 g/dL   AST 12  0 - 37 U/L   ALT 8  0 - 35 U/L   Alkaline Phosphatase 83  39 - 117 U/L   Total Bilirubin <0.2 (*) 0.3 - 1.2 mg/dL   GFR calc non Af Amer >90  >90 mL/min   GFR calc Af Amer >90  >90 mL/min   Anion gap 14  5 - 15   Ct Abdomen Pelvis W Contrast  06/29/2014   CLINICAL DATA:  Right abdominal pain, leukocytosis, C-section 05/21/2014. History of kidney stones.  EXAM: CT ABDOMEN AND PELVIS WITH CONTRAST  TECHNIQUE: Multidetector CT imaging of the abdomen and pelvis was performed using the standard protocol following bolus administration of intravenous contrast.  CONTRAST:  100mL OMNIPAQUE IOHEXOL 300 MG/ML  SOLN  COMPARISON:  06/16/2014  FINDINGS: Mild ground-glass opacities at the lung bases, favored to reflect atelectasis. Normal heart size.  Heterogeneous hepatic and splenic attenuation may be accentuated by contrast bolus timing. Hepatic steatosis not excluded. Cholecystectomy. No biliary ductal dilatation. No appreciable abnormality of the pancreas, adrenal glands, left kidney.  Delayed right renal enhancement. Right perinephric fat stranding and ill-defined fluid. Moderate to severe hydroureteronephrosis to the level of an 8 mm right UVJ stone. No loculated fluid collection.  No overt colitis. Decompressed appendix. Small bowel loops are of normal course and caliber. No free intraperitoneal air. No lymphadenopathy.  Normal caliber aorta and branch vessels.  Thin walled bladder. Similar appearance to the uterus, recently postpartum. Free fluid within the pelvis. No adnexal mass.  No acute osseous finding. Changes in the anterior abdominal wall in keeping with recent C-section.  IMPRESSION: Moderate to severe right  hydroureteronephrosis to the level of an 8 mm right UVJ stone.  Right periureteral  and perinephric fat stranding/ill-defined fluid may reflect superimposed infection/ pyelonephritis.  Heterogeneous hepatic attenuation may be accentuated by contrast bolus timing. Steatosis or hepatitis not excluded.   Electronically Signed   By: Jearld LeschAndrew  DelGaizo M.D.   On: 06/29/2014 03:36    MAU Course  Procedures None  MDM Discussed patient presentation and history of Dr. Adrian BlackwaterStinson. He will come to MAU to evaluate the patient's incision site.  Per Dr. Adrian BlackwaterStinson, CBC, CMP, UA and CT of abdomen and pelvis with contrast. IV pain medication.  1 liter NS IV started. 1 mg Dilaudid given. Patient reports some improvement in pain and appears more comfortable.  Discussed results of labs and CT scan with Dr. Adrian BlackwaterStinson. Patient will need urology referral. Can be discharged with Rx for Percocet.  Patient returns from radiology requesting additional pain medication. 1 mg Dilaudid given prior to discharge.  Assessment and Plan  A: Kidney stone Wound dehiscence s/p cesarean section   P: Discharge home Rx for Percocet given to patient Patient will be referred to Urologist Patient advised that she does not need [facility], but they will be calling her to set up wound care Patient may return to MAU as needed or if her condition were to change or worsen   Marny LowensteinJulie N Tajah Schreiner, PA-C  06/29/2014, 3:59 AM

## 2014-06-28 NOTE — ED Notes (Signed)
Patient demanding to be taken straight back to the ER. States that she can't stay in the waiting room. Informed patient there were not any beds in the ER at this time. Patient remains in wheelchair and placed in the hallway behind triage.

## 2014-06-28 NOTE — ED Notes (Signed)
Patient ambulated to triage to let me know that she could not wait in the waiting room. Informed her that the rooms in the back were taken by patients. Patient then informed me that she was leaving, going to call an ambulance and come back to the ER in the ambulance. Patient states that she is tired of this.

## 2014-06-28 NOTE — MAU Note (Signed)
Pt in by EMS-Had c section on 05-21-2014. States c section scar started hurting one hour ago and that it opened a bit. Given antibiotics and states she took them all a few days ago.

## 2014-06-28 NOTE — ED Notes (Signed)
Patient complaining of pain in right flank and right side of abdomen. States that the pain started suddenly around 45 minutes ago. Had a c-section on May 21, 2014.

## 2014-06-28 NOTE — ED Notes (Signed)
Patient taken back to waiting room behind fasttrack

## 2014-06-29 ENCOUNTER — Encounter: Payer: Medicaid Other | Admitting: Advanced Practice Midwife

## 2014-06-29 ENCOUNTER — Encounter: Payer: Self-pay | Admitting: Advanced Practice Midwife

## 2014-06-29 ENCOUNTER — Inpatient Hospital Stay (HOSPITAL_COMMUNITY): Payer: Medicaid Other

## 2014-06-29 ENCOUNTER — Encounter (HOSPITAL_COMMUNITY): Payer: Self-pay

## 2014-06-29 ENCOUNTER — Telehealth: Payer: Self-pay | Admitting: Medical

## 2014-06-29 DIAGNOSIS — N200 Calculus of kidney: Secondary | ICD-10-CM

## 2014-06-29 LAB — URINALYSIS, ROUTINE W REFLEX MICROSCOPIC
Glucose, UA: NEGATIVE mg/dL
Ketones, ur: 15 mg/dL — AB
Leukocytes, UA: NEGATIVE
NITRITE: NEGATIVE
PH: 5.5 (ref 5.0–8.0)
Protein, ur: 100 mg/dL — AB
SPECIFIC GRAVITY, URINE: 1.02 (ref 1.005–1.030)
Urobilinogen, UA: 0.2 mg/dL (ref 0.0–1.0)

## 2014-06-29 LAB — COMPREHENSIVE METABOLIC PANEL
ALBUMIN: 3.5 g/dL (ref 3.5–5.2)
ALK PHOS: 83 U/L (ref 39–117)
ALT: 8 U/L (ref 0–35)
AST: 12 U/L (ref 0–37)
Anion gap: 14 (ref 5–15)
BUN: 10 mg/dL (ref 6–23)
CHLORIDE: 101 meq/L (ref 96–112)
CO2: 22 mEq/L (ref 19–32)
Calcium: 9 mg/dL (ref 8.4–10.5)
Creatinine, Ser: 0.62 mg/dL (ref 0.50–1.10)
GFR calc Af Amer: >90 mL/min (ref >90)
GFR calc non Af Amer: >90 mL/min (ref >90)
GLUCOSE: 107 mg/dL — AB (ref 70–99)
POTASSIUM: 3.4 meq/L — AB (ref 3.7–5.3)
Sodium: 137 mEq/L (ref 137–147)
Total Protein: 7.2 g/dL (ref 6.0–8.3)

## 2014-06-29 LAB — CBC WITH DIFFERENTIAL/PLATELET
Basophils Absolute: 0 10*3/uL (ref 0.0–0.1)
Basophils Relative: 0 % (ref 0–1)
Eosinophils Absolute: 0.1 10*3/uL (ref 0.0–0.7)
Eosinophils Relative: 1 % (ref 0–5)
HCT: 31.8 % — ABNORMAL LOW (ref 36.0–46.0)
Hemoglobin: 10.6 g/dL — ABNORMAL LOW (ref 12.0–15.0)
Lymphocytes Relative: 9 % — ABNORMAL LOW (ref 12–46)
Lymphs Abs: 1.2 10*3/uL (ref 0.7–4.0)
MCH: 29.8 pg (ref 26.0–34.0)
MCHC: 33.3 g/dL (ref 30.0–36.0)
MCV: 89.3 fL (ref 78.0–100.0)
Monocytes Absolute: 0.6 10*3/uL (ref 0.1–1.0)
Monocytes Relative: 5 % (ref 3–12)
Neutro Abs: 11.1 10*3/uL — ABNORMAL HIGH (ref 1.7–7.7)
Neutrophils Relative %: 85 % — ABNORMAL HIGH (ref 43–77)
Platelets: 315 10*3/uL (ref 150–400)
RBC: 3.56 MIL/uL — ABNORMAL LOW (ref 3.87–5.11)
RDW: 13.3 % (ref 11.5–15.5)
WBC: 13 10*3/uL — ABNORMAL HIGH (ref 4.0–10.5)

## 2014-06-29 LAB — URINE MICROSCOPIC-ADD ON

## 2014-06-29 MED ORDER — HYDROMORPHONE HCL 1 MG/ML IJ SOLN
1.0000 mg | Freq: Once | INTRAMUSCULAR | Status: AC
  Administered 2014-06-29: 1 mg via INTRAVENOUS
  Filled 2014-06-29: qty 1

## 2014-06-29 MED ORDER — ONDANSETRON HCL 4 MG/2ML IJ SOLN
4.0000 mg | Freq: Once | INTRAMUSCULAR | Status: AC
  Administered 2014-06-29: 4 mg via INTRAVENOUS
  Filled 2014-06-29: qty 2

## 2014-06-29 MED ORDER — IOHEXOL 300 MG/ML  SOLN
50.0000 mL | INTRAMUSCULAR | Status: AC
  Administered 2014-06-29: 50 mL via ORAL

## 2014-06-29 MED ORDER — IOHEXOL 300 MG/ML  SOLN
100.0000 mL | Freq: Once | INTRAMUSCULAR | Status: AC | PRN
  Administered 2014-06-29: 100 mL via INTRAVENOUS

## 2014-06-29 MED ORDER — SODIUM CHLORIDE 0.9 % IV SOLN: INTRAVENOUS | Status: DC

## 2014-06-29 MED ORDER — SODIUM CHLORIDE 0.9 % IV SOLN
INTRAVENOUS | Status: DC
  Administered 2014-06-29: 125 mL/h via INTRAVENOUS

## 2014-06-29 MED ORDER — OXYCODONE-ACETAMINOPHEN 5-325 MG PO TABS: 1.0000 | ORAL_TABLET | ORAL | Status: DC | PRN

## 2014-06-29 NOTE — MAU Provider Note (Signed)
Attestation of Attending Supervision of Advanced Practitioner (PA/CNM/NP): Evaluation and management procedures were performed by the Advanced Practitioner under my supervision and collaboration.  I have seen and examined this patient.  I have reviewed the Advanced Practitioner's note and chart, and I agree with the management and plan.  Davy Westmoreland, DO Attending Physician Faculty Practice, [facility]  

## 2014-06-29 NOTE — Discharge Instructions (Signed)

## 2014-06-29 NOTE — Telephone Encounter (Signed)
LM for patient to return call to MAU for appointment information. Patient has urology appointment at 1030 am today with [facility]. Dr. Lorin PicketScott McDiarmid [facility], 2nd floor [facility]. If patient needs later appointment she may call to reschedule at [phone].   Marny LowensteinJulie N Wenzel, PA-C 06/29/2014 8:27 AM

## 2014-06-30 ENCOUNTER — Encounter: Payer: Medicaid Other | Admitting: Advanced Practice Midwife

## 2014-07-04 ENCOUNTER — Encounter (HOSPITAL_COMMUNITY): Payer: Self-pay

## 2014-07-06 ENCOUNTER — Ambulatory Visit: Payer: Medicaid Other | Admitting: Advanced Practice Midwife

## 2014-07-06 NOTE — Progress Notes (Signed)
No show

## 2014-07-26 ENCOUNTER — Other Ambulatory Visit: Payer: Self-pay | Admitting: Advanced Practice Midwife

## 2014-07-26 ENCOUNTER — Telehealth: Payer: Self-pay | Admitting: Advanced Practice Midwife

## 2014-07-26 MED ORDER — ESCITALOPRAM OXALATE 10 MG PO TABS: 10.0000 mg | ORAL_TABLET | Freq: Every day | ORAL | Status: DC

## 2014-07-26 MED ORDER — ALPRAZOLAM 0.25 MG PO TABS: 0.2500 mg | ORAL_TABLET | Freq: Two times a day (BID) | ORAL | Status: DC | PRN

## 2014-07-26 NOTE — Telephone Encounter (Signed)
Left message expressing condolences and offered pt outpt referral for counseling or inpt referral for psychiatric care if pt having SI.

## 2014-07-26 NOTE — Progress Notes (Signed)
Pt called back, requested xanax.  Denied SI "I have 2 other kids to live for".  Offered and accepted SSRI and therapy referral.  Sent to [facility]. Xanax .25 # 30 no Rf and Lexapro 10mg  # 30 6 RF

## 2014-08-01 ENCOUNTER — Other Ambulatory Visit: Payer: Self-pay | Admitting: Obstetrics & Gynecology

## 2014-08-01 ENCOUNTER — Telehealth: Payer: Self-pay | Admitting: Obstetrics & Gynecology

## 2014-08-01 ENCOUNTER — Telehealth: Payer: Self-pay | Admitting: *Deleted

## 2014-08-01 MED ORDER — ZOLPIDEM TARTRATE 10 MG PO TABS: 10.0000 mg | ORAL_TABLET | Freq: Every evening | ORAL | Status: DC | PRN

## 2014-08-01 MED ORDER — ALPRAZOLAM 0.5 MG PO TABS: 0.2500 mg | ORAL_TABLET | Freq: Three times a day (TID) | ORAL | Status: DC | PRN

## 2014-08-02 NOTE — Telephone Encounter (Signed)
Call handled

## 2014-08-15 ENCOUNTER — Telehealth: Payer: Self-pay | Admitting: *Deleted

## 2014-08-15 ENCOUNTER — Other Ambulatory Visit: Payer: Self-pay | Admitting: Obstetrics & Gynecology
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
## 2014-08-15 NOTE — Telephone Encounter (Signed)
Pt states sent a family member to get her meds and belongings but they were no longer there. Per Dr. Despina HiddenEure pt will need to call the pharmacy and inform them of what happen and go from there. Pt verbalized understanding.

## 2014-09-18 ENCOUNTER — Encounter (HOSPITAL_COMMUNITY): Payer: Self-pay | Admitting: Emergency Medicine

## 2014-09-18 ENCOUNTER — Emergency Department (HOSPITAL_COMMUNITY): Payer: Medicaid Other

## 2014-09-18 ENCOUNTER — Emergency Department (HOSPITAL_COMMUNITY)
Admission: EM | Admit: 2014-09-18 | Discharge: 2014-09-18 | Disposition: A | Discharge status: Home / Self Care | Payer: Medicaid Other | Attending: Emergency Medicine | Admitting: Emergency Medicine

## 2014-09-18 DIAGNOSIS — F329 Major depressive disorder, single episode, unspecified: Secondary | ICD-10-CM | POA: Diagnosis not present

## 2014-09-18 DIAGNOSIS — Y9389 Activity, other specified: Secondary | ICD-10-CM | POA: Insufficient documentation

## 2014-09-18 DIAGNOSIS — Z8719 Personal history of other diseases of the digestive system: Secondary | ICD-10-CM | POA: Insufficient documentation

## 2014-09-18 DIAGNOSIS — Y9289 Other specified places as the place of occurrence of the external cause: Secondary | ICD-10-CM | POA: Insufficient documentation

## 2014-09-18 DIAGNOSIS — F410 Panic disorder [episodic paroxysmal anxiety] without agoraphobia: Secondary | ICD-10-CM | POA: Diagnosis not present

## 2014-09-18 DIAGNOSIS — Z79899 Other long term (current) drug therapy: Secondary | ICD-10-CM | POA: Insufficient documentation

## 2014-09-18 DIAGNOSIS — S92344B Nondisplaced fracture of fourth metatarsal bone, right foot, initial encounter for open fracture: Secondary | ICD-10-CM | POA: Diagnosis not present

## 2014-09-18 DIAGNOSIS — W108XXA Fall (on) (from) other stairs and steps, initial encounter: Secondary | ICD-10-CM | POA: Insufficient documentation

## 2014-09-18 DIAGNOSIS — Y998 Other external cause status: Secondary | ICD-10-CM | POA: Insufficient documentation

## 2014-09-18 DIAGNOSIS — Z720 Tobacco use: Secondary | ICD-10-CM | POA: Diagnosis not present

## 2014-09-18 DIAGNOSIS — Z87442 Personal history of urinary calculi: Secondary | ICD-10-CM | POA: Insufficient documentation

## 2014-09-18 DIAGNOSIS — S92301B Fracture of unspecified metatarsal bone(s), right foot, initial encounter for open fracture: Secondary | ICD-10-CM

## 2014-09-18 DIAGNOSIS — S99921A Unspecified injury of right foot, initial encounter: Secondary | ICD-10-CM | POA: Diagnosis present

## 2014-09-18 DIAGNOSIS — W19XXXA Unspecified fall, initial encounter: Secondary | ICD-10-CM

## 2014-09-18 MED ORDER — HYDROCODONE-ACETAMINOPHEN 5-325 MG PO TABS: 1.0000 | ORAL_TABLET | ORAL | Status: DC | PRN

## 2014-09-18 NOTE — ED Notes (Signed)
p c/o right foot pain after tripping and falling yesterday afternoon. C/m/s intact. nad noted.

## 2014-09-18 NOTE — Discharge Instructions (Signed)
You have fractured a metatarsal bone in her foot.   Ice, elevate, support boot, pain meds. Referral to orthopedics for continued pain. Phone number given.

## 2014-09-18 NOTE — ED Notes (Signed)
Pt up to bathroom.

## 2014-09-18 NOTE — ED Provider Notes (Signed)
CSN: [id number]     Arrival date & time 09/18/14  [id number] History  This chart was scribed for Donnetta HutchingBrian Falesha Schommer, MD by Littie Deedsichard Sun, ED Scribe. This patient was seen in room APA17/APA17 and the patient's care was started at 9:09 AM.       Chief Complaint  Patient presents with  . Foot Pain   The history is provided by the patient. No language interpreter was used.   HPI Comments: Rachel Meadows is a 24 y.o. female who presents to the Emergency Department complaining of sudden onset right foot pain that occurred yesterday afternoon after tripping while going up stairs and falling. She notes that the steps are made of steel and may have jammed her foot in the step. Patient denies injuries elsewhere.   Past Medical History  Diagnosis Date  . Depression     panic attacks  . Anxiety   . Hearing loss   . GERD (gastroesophageal reflux disease)   . Kidney stones   . Pregnant    Past Surgical History  Procedure Laterality Date  . Cholecystectomy    . Anoidectomy      adenoidectomy  . Tonsillectomy    . External ear surgery Bilateral   . Cesarean section N/A 05/21/2014    Procedure: CESAREAN SECTION;  Surgeon: Adam PhenixJames G Arnold, MD;  Location: [facility];  Service: Obstetrics;  Laterality: N/A;   Family History  Problem Relation Age of Onset  . Kidney failure Brother    History  Substance Use Topics  . Smoking status: Current Every Day Smoker -- 0.25 packs/day for 5 years    Types: Cigarettes  . Smokeless tobacco: Never Used  . Alcohol Use: No   OB History    Gravida Para Term Preterm AB TAB SAB Ectopic Multiple Living   3 3 2 1      3      Review of Systems  All other systems reviewed and are negative.     Allergies  Review of patient's allergies indicates no known allergies.  Home Medications   Prior to Admission medications   Medication Sig Start Date End Date Taking? Authorizing Provider  ALPRAZolam Prudy Feeler(XANAX) 0.5 MG tablet Take 0.5 tablets (0.25 mg total) by mouth 3 (three) times  daily as needed for anxiety. 08/01/14  Yes Lazaro ArmsLuther H Eure, MD  escitalopram (LEXAPRO) 10 MG tablet Take 1 tablet (10 mg total) by mouth daily. 07/26/14  Yes Jacklyn ShellFrances Cresenzo-Dishmon, CNM  zolpidem (AMBIEN) 10 MG tablet Take 1 tablet (10 mg total) by mouth at bedtime as needed for sleep. 08/01/14 09/18/14 Yes Lazaro ArmsLuther H Eure, MD  cephALEXin (KEFLEX) 500 MG capsule Take 1 capsule (500 mg total) by mouth 2 (two) times daily. Patient not taking: Reported on 09/18/2014 06/16/14   Tomasita CrumbleAdeleke Oni, MD  doxycycline (VIBRAMYCIN) 100 MG capsule Take 1 capsule (100 mg total) by mouth 2 (two) times daily. One po bid x 7 days Patient not taking: Reported on 09/18/2014 06/16/14   Tomasita CrumbleAdeleke Oni, MD  HYDROcodone-acetaminophen (NORCO) 5-325 MG per tablet Take 1-2 tablets by mouth every 4 (four) hours as needed. 09/18/14   Donnetta HutchingBrian Hutton Pellicane, MD  ibuprofen (ADVIL,MOTRIN) 600 MG tablet Take 1 tablet (600 mg total) by mouth every 6 (six) hours as needed for mild pain. Patient not taking: Reported on 09/18/2014 05/23/14   Catalina AntiguaPeggy Constant, MD  oxyCODONE-acetaminophen (ROXICET) 5-325 MG per tablet Take 1-2 tablets by mouth every 4 (four) hours as needed for severe pain. Patient not taking: Reported on 09/18/2014 06/29/14  Marny Lowenstein, PA-C   BP 126/79 mmHg  Pulse 85  Temp(Src) 97.7 F (36.5 C)  Resp 18  Ht 5' (1.524 m)  Wt 150 lb (68.04 kg)  BMI 29.30 kg/m2  SpO2 99%  LMP 09/13/2014 Physical Exam  Constitutional: She is oriented to person, place, and time. She appears well-developed and well-nourished.  HENT:  Head: Normocephalic and atraumatic.  Musculoskeletal: She exhibits tenderness.  Tender on lateral dorsal aspect of right foot.  Neurological: She is alert and oriented to person, place, and time.  Skin: Skin is warm and dry.  Psychiatric: She has a normal mood and affect. Her behavior is normal.  Nursing note and vitals reviewed.   ED Course  Procedures  DIAGNOSTIC STUDIES: Oxygen Saturation is 99% on room air,  normal by my interpretation.    COORDINATION OF CARE: 9:12 AM-Discussed treatment plan which includes XR with pt at bedside and pt agreed to plan.    Labs Review Labs Reviewed - No data to display  Imaging Review Dg Foot Complete Right  09/18/2014   CLINICAL DATA:  Fall yesterday, swelling and pain across metatarsals of right foot, pain at right 3rd and 4th toe  EXAM: RIGHT FOOT COMPLETE - 3+ VIEW  COMPARISON:  11/18/2013.  FINDINGS: Interval subtle deformity of the fourth metatarsal neck with a suggestion of a small amount of cortical disruption and ill-defined sclerosis. The remainder of the bones and soft tissues have normal appearances.  IMPRESSION: Probable nondisplaced, mildly impacted fracture of the fourth metatarsal neck.   Electronically Signed   By: Gordan Payment M.D.   On: 09/18/2014 10:01     EKG Interpretation None      MDM   Final diagnoses:  Fracture, metatarsal, open, right, initial encounter    Plain films of right foot reveal a nondisplaced mildly impacted fracture of the distal aspect of the fourth metatarsal.  Discussed with patient and her boyfriend. Wooden shoe, ice, elevate, pain medicine, referral to orthopedics  I personally performed the services described in this documentation, which was scribed in my presence. The recorded information has been reviewed and is accurate.    Donnetta Hutching, MD 09/18/14 1240

## 2014-09-20 ENCOUNTER — Ambulatory Visit (INDEPENDENT_AMBULATORY_CARE_PROVIDER_SITE_OTHER): Payer: Medicaid Other | Admitting: Orthopedic Surgery

## 2014-09-20 ENCOUNTER — Encounter: Payer: Self-pay | Admitting: Orthopedic Surgery

## 2014-09-20 VITALS — BP 128/78 | Ht 60.0 in | Wt 150.0 lb

## 2014-09-20 DIAGNOSIS — S92301A Fracture of unspecified metatarsal bone(s), right foot, initial encounter for closed fracture: Secondary | ICD-10-CM

## 2014-09-20 MED ORDER — HYDROCODONE-ACETAMINOPHEN 7.5-325 MG PO TABS: 1.0000 | ORAL_TABLET | ORAL | Status: DC | PRN

## 2014-09-20 MED ORDER — IBUPROFEN 800 MG PO TABS: 800.0000 mg | ORAL_TABLET | Freq: Three times a day (TID) | ORAL | Status: AC | PRN

## 2014-09-20 NOTE — Progress Notes (Signed)
Patient ID: Sandford CrazeKara S Wysocki, female   DOB: 04/28/1991, 24 y.o.   MRN: [id number]  Chief Complaint  Patient presents with  . Foot Injury    Right foot fx, DOI 09/17/13    HPI Sandford CrazeKara S Dentinger is a 24 y.o. female.  Presents for evaluation of right 4th metatarsal fracture. This patient was running and hit her foot up against a metal pole on some metal steps and injured her foot on Jerry 16 2015. She was evaluated in the emergency room she had x-rays and they showed a nondisplaced fifth metatarsal fracture she was placed in a postop shoe she complains of moderate pain on hydrocodone and ibuprofen. She denies numbness tingling fever headache chills but does describe gait disturbance. Review of Systems Review of Systems 1. As noted in the history No Known Allergies  Current Outpatient Prescriptions  Medication Sig Dispense Refill  . ALPRAZolam (XANAX) 0.5 MG tablet Take 0.5 tablets (0.25 mg total) by mouth 3 (three) times daily as needed for anxiety. 90 tablet 1  . HYDROcodone-acetaminophen (NORCO) 5-325 MG per tablet Take 1-2 tablets by mouth every 4 (four) hours as needed. 20 tablet 0  . HYDROcodone-acetaminophen (NORCO) 7.5-325 MG per tablet Take 1 tablet by mouth every 4 (four) hours as needed for moderate pain. 42 tablet 0  . ibuprofen (ADVIL,MOTRIN) 800 MG tablet Take 1 tablet (800 mg total) by mouth every 8 (eight) hours as needed. 90 tablet 2  . zolpidem (AMBIEN) 10 MG tablet Take 1 tablet (10 mg total) by mouth at bedtime as needed for sleep. 30 tablet 3   No current facility-administered medications for this visit.   Past Medical History  Diagnosis Date  . Depression     panic attacks  . Anxiety   . Hearing loss   . GERD (gastroesophageal reflux disease)   . Kidney stones   . Pregnant       Physical Exam Physical Exam Blood pressure 128/78, height 5' (1.524 m), weight 150 lb (68.04 kg), last menstrual period 09/13/2014, unknown if currently breastfeeding.  Gen. appearance  normal   The patient is alert and oriented person place and time, mood and affect are normal   ambulatory status is guarded with postop shoe and limp  Exam of the  right foot reveals swelling and tenderness over the involved fracture with painful range of motion of the metatarsal phalangeal joint but there is no instability in the muscle tone in the foot is normal. The skin is slightly ecchymotic but intact she has good distal pulses and normal sensation.  Data Reviewed Hospital film I interpreted as a nondisplaced closed right 4th metatarsal fracture Assessment    Same     Plan    Postop shoe Ace wrap pain medication come back 4 weeks for x-ray       Fuller CanadaStanley Nabeel Gladson 09/20/2014, 3:02 PM

## 2014-09-26 ENCOUNTER — Other Ambulatory Visit: Payer: Self-pay | Admitting: *Deleted

## 2014-09-26 ENCOUNTER — Telehealth: Payer: Self-pay | Admitting: Orthopedic Surgery

## 2014-09-26 MED ORDER — HYDROCODONE-ACETAMINOPHEN 7.5-325 MG PO TABS: 1.0000 | ORAL_TABLET | ORAL | Status: DC | PRN

## 2014-09-26 NOTE — Telephone Encounter (Signed)
Patient is calling requesting pain medication refill HYDROcodone-acetaminophen (NORCO) 7.5-325 MG per tablet [[id number]][[id number]] please advise?

## 2014-09-27 ENCOUNTER — Other Ambulatory Visit: Payer: Self-pay | Admitting: *Deleted

## 2014-09-27 MED ORDER — HYDROCODONE-ACETAMINOPHEN 7.5-325 MG PO TABS: 1.0000 | ORAL_TABLET | ORAL | Status: DC | PRN

## 2014-09-27 NOTE — Telephone Encounter (Signed)
Prescription is available for pick up, patient is aware  

## 2014-09-27 NOTE — Telephone Encounter (Signed)
Patient picked up Rx

## 2014-10-03 ENCOUNTER — Telehealth: Payer: Self-pay | Admitting: Orthopedic Surgery

## 2014-10-03 NOTE — Telephone Encounter (Signed)
????    Did we check her medication date ???  She had a fracture we gave her a script looks like 42 tabs and it s time for another one right???

## 2014-10-03 NOTE — Telephone Encounter (Signed)
Patient is calling complaining of right foot pain, she states she is icing and has ace bandage wrapped and boot is on, she states its throbbing and she is out of pain medication, she has a follow up appointment in 2 weeks, please advise?

## 2014-10-03 NOTE — Telephone Encounter (Signed)
Routing to Dr Harrison 

## 2014-10-04 ENCOUNTER — Other Ambulatory Visit: Payer: Self-pay | Admitting: *Deleted

## 2014-10-04 MED ORDER — HYDROCODONE-ACETAMINOPHEN 7.5-325 MG PO TABS: 1.0000 | ORAL_TABLET | ORAL | Status: DC | PRN

## 2014-10-04 NOTE — Telephone Encounter (Signed)
Patient Picked up Rx 

## 2014-10-04 NOTE — Telephone Encounter (Signed)
Patient is aware 

## 2014-10-04 NOTE — Telephone Encounter (Signed)
Prescription available for pick up, called patient, no answer 

## 2014-10-10 ENCOUNTER — Other Ambulatory Visit: Payer: Self-pay | Admitting: *Deleted

## 2014-10-10 ENCOUNTER — Telehealth: Payer: Self-pay | Admitting: Orthopedic Surgery

## 2014-10-10 MED ORDER — HYDROCODONE-ACETAMINOPHEN 7.5-325 MG PO TABS: 1.0000 | ORAL_TABLET | ORAL | Status: DC | PRN

## 2014-10-10 NOTE — Telephone Encounter (Signed)
Patient picked up Rx

## 2014-10-10 NOTE — Telephone Encounter (Signed)
Patient called, confirmed her appointment for 10/18/14, and requests refill of medication refill: HYDROcodone-acetaminophen (NORCO) 7.5-325 MG per tablet [[id number]][[id number]]  - her ph# is [phone]

## 2014-10-10 NOTE — Telephone Encounter (Signed)
Prescription available, patient aware  

## 2014-10-18 ENCOUNTER — Ambulatory Visit (INDEPENDENT_AMBULATORY_CARE_PROVIDER_SITE_OTHER): Payer: Self-pay

## 2014-10-18 ENCOUNTER — Ambulatory Visit (INDEPENDENT_AMBULATORY_CARE_PROVIDER_SITE_OTHER): Payer: Self-pay | Admitting: Orthopedic Surgery

## 2014-10-18 ENCOUNTER — Encounter: Payer: Self-pay | Admitting: Orthopedic Surgery

## 2014-10-18 VITALS — Ht 60.0 in | Wt 150.0 lb

## 2014-10-18 DIAGNOSIS — S92901D Unspecified fracture of right foot, subsequent encounter for fracture with routine healing: Secondary | ICD-10-CM

## 2014-10-18 MED ORDER — HYDROCODONE-ACETAMINOPHEN 5-325 MG PO TABS: 1.0000 | ORAL_TABLET | Freq: Four times a day (QID) | ORAL | Status: DC | PRN

## 2014-10-18 NOTE — Patient Instructions (Signed)
Continue wearing shoe

## 2014-10-18 NOTE — Progress Notes (Signed)
Chief Complaint  Patient presents with  . Follow-up    4 week recheck Rt foot fx, DOI 09/17/14     Current outpatient prescriptions:  .  ALPRAZolam (XANAX) 0.5 MG tablet, Take 0.5 tablets (0.25 mg total) by mouth 3 (three) times daily as needed for anxiety., Disp: 90 tablet, Rfl: 1 .  HYDROcodone-acetaminophen (NORCO) 5-325 MG per tablet, Take 1-2 tablets by mouth every 4 (four) hours as needed., Disp: 20 tablet, Rfl: 0 .  HYDROcodone-acetaminophen (NORCO) 7.5-325 MG per tablet, Take 1 tablet by mouth every 4 (four) hours as needed for moderate pain., Disp: 42 tablet, Rfl: 0 .  ibuprofen (ADVIL,MOTRIN) 800 MG tablet, Take 1 tablet (800 mg total) by mouth every 8 (eight) hours as needed., Disp: 90 tablet, Rfl: 2 .  zolpidem (AMBIEN) 10 MG tablet, Take 1 tablet (10 mg total) by mouth at bedtime as needed for sleep., Disp: 30 tablet, Rfl: 3   Ht 5' (1.524 m)  Wt 150 lb (68.04 kg)  BMI 29.30 kg/m2  LMP 09/09/2014  Encounter Diagnosis  Name Primary?  Marland Kitchen. Foot fracture, right, with routine healing, subsequent encounter Yes    She still complains of tenderness over the fracture site so we'll give her 2 more weeks and then recheck no x-ray needed. Continue hydrocodone 5 mg

## 2014-10-24 ENCOUNTER — Other Ambulatory Visit: Payer: Self-pay | Admitting: *Deleted

## 2014-10-24 ENCOUNTER — Telehealth: Payer: Self-pay | Admitting: Orthopedic Surgery

## 2014-10-24 NOTE — Telephone Encounter (Signed)
Patient called to request refill on medication: HYDROcodone-acetaminophen (NORCO/VICODIN) 5-325 MG per tablet [[id number]][[id number]] - ph# [phone]

## 2014-10-24 NOTE — Telephone Encounter (Signed)
IF PATIENT CALLS BACK PLEASE ADVISE, NOT DUE UNTIL 11/01/14

## 2014-10-26 NOTE — Telephone Encounter (Signed)
Relayed to patient as noted - 10/25/14.

## 2014-11-01 ENCOUNTER — Ambulatory Visit (INDEPENDENT_AMBULATORY_CARE_PROVIDER_SITE_OTHER): Payer: Self-pay | Admitting: Orthopedic Surgery

## 2014-11-01 ENCOUNTER — Encounter: Payer: Self-pay | Admitting: Orthopedic Surgery

## 2014-11-01 VITALS — BP 127/77 | Ht 60.0 in | Wt 150.0 lb

## 2014-11-01 DIAGNOSIS — S92901D Unspecified fracture of right foot, subsequent encounter for fracture with routine healing: Secondary | ICD-10-CM

## 2014-11-01 MED ORDER — HYDROCODONE-ACETAMINOPHEN 5-325 MG PO TABS: 1.0000 | ORAL_TABLET | Freq: Four times a day (QID) | ORAL | Status: DC | PRN

## 2014-11-01 NOTE — Progress Notes (Signed)
Follow-up visit for right foot fracture fourth metatarsal two-week follow-up after x-rays show fracture healing patient only complains of mild discomfort  She's returning to a new job now she is not tender over the fracture anymore walking without a limp she is released she can take some Norco one every 6 when necessary pain #40 no further refills follow-up when necessary

## 2014-11-15 ENCOUNTER — Other Ambulatory Visit: Payer: Self-pay | Admitting: *Deleted

## 2014-11-15 ENCOUNTER — Telehealth: Payer: Self-pay | Admitting: Orthopedic Surgery

## 2014-11-15 ENCOUNTER — Telehealth: Payer: Self-pay | Admitting: Obstetrics & Gynecology

## 2014-11-15 MED ORDER — ALPRAZOLAM 0.5 MG PO TABS: 0.2500 mg | ORAL_TABLET | Freq: Three times a day (TID) | ORAL | Status: DC | PRN

## 2014-11-15 MED ORDER — HYDROCODONE-ACETAMINOPHEN 5-325 MG PO TABS
1.0000 | ORAL_TABLET | Freq: Three times a day (TID) | ORAL | Status: DC | PRN
Start: 2014-11-15 — End: 2015-04-01

## 2014-11-15 MED ORDER — ZOLPIDEM TARTRATE 10 MG PO TABS: 10.0000 mg | ORAL_TABLET | Freq: Every evening | ORAL | Status: AC | PRN

## 2014-11-15 NOTE — Telephone Encounter (Signed)
Pt requesting refill on Xanax and Ambien.

## 2014-11-15 NOTE — Telephone Encounter (Signed)
Xanax and ambien refilled

## 2014-11-15 NOTE — Telephone Encounter (Signed)
She should take 800 mg of ibuprofen 3 times a day  You can refill the hydrocodone 01/03/2024 one tablet every 8 hours #30

## 2014-11-15 NOTE — Telephone Encounter (Signed)
Patient picked up Rx

## 2014-11-15 NOTE — Telephone Encounter (Signed)
Routing to Dr Harrison 

## 2014-11-15 NOTE — Telephone Encounter (Signed)
Patient is aware prescription is available

## 2014-11-15 NOTE — Telephone Encounter (Signed)
Pt informed Ambien and [facility]. Pt requesting Dr. Despina HiddenEure to also give RX for her depression medication. Pt informed since Dr. Despina HiddenEure has not seen or treated her for her depression she would need to schedule an appt. Call transferred to front staff for an appt to be scheduled.

## 2014-11-15 NOTE — Telephone Encounter (Signed)
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  

## 2014-11-15 NOTE — Telephone Encounter (Signed)
Patient is calling asking for a refill on pain medicationHYDROcodone-acetaminophen (NORCO/VICODIN) 5-325 MG per tablet she states that she stands 8hrs in one place at work and she is asking for the medication for when she comes home from work her Rt foot is swollen and hurting after work not sure if its because she hasnt worked [place] [employer] please advise?

## 2014-11-22 ENCOUNTER — Ambulatory Visit: Payer: Medicaid Other | Admitting: Obstetrics & Gynecology

## 2014-11-30 ENCOUNTER — Telehealth: Payer: Self-pay | Admitting: Orthopedic Surgery

## 2014-11-30 NOTE — Telephone Encounter (Signed)
Patient is calling requesting a refill on pain medication HYDROcodone-acetaminophen (NORCO/VICODIN) 5-325 MG per tablet  Please advise? 

## 2014-12-01 ENCOUNTER — Other Ambulatory Visit: Payer: Self-pay | Admitting: *Deleted

## 2014-12-01 NOTE — Telephone Encounter (Signed)
This patient has been released from care from foot fracture, no prescription

## 2014-12-01 NOTE — Telephone Encounter (Signed)
Called Patient left message on machine

## 2014-12-11 ENCOUNTER — Encounter (HOSPITAL_COMMUNITY): Payer: Self-pay | Admitting: Emergency Medicine

## 2014-12-11 ENCOUNTER — Emergency Department (HOSPITAL_COMMUNITY)
Admission: EM | Admit: 2014-12-11 | Discharge: 2014-12-11 | Disposition: A | Discharge status: Home / Self Care | Payer: Medicaid Other | Attending: Emergency Medicine | Admitting: Emergency Medicine

## 2014-12-11 DIAGNOSIS — Y998 Other external cause status: Secondary | ICD-10-CM | POA: Insufficient documentation

## 2014-12-11 DIAGNOSIS — Y92511 Restaurant or cafe as the place of occurrence of the external cause: Secondary | ICD-10-CM | POA: Insufficient documentation

## 2014-12-11 DIAGNOSIS — Z87442 Personal history of urinary calculi: Secondary | ICD-10-CM | POA: Insufficient documentation

## 2014-12-11 DIAGNOSIS — Y9389 Activity, other specified: Secondary | ICD-10-CM | POA: Insufficient documentation

## 2014-12-11 DIAGNOSIS — S29019A Strain of muscle and tendon of unspecified wall of thorax, initial encounter: Secondary | ICD-10-CM | POA: Insufficient documentation

## 2014-12-11 DIAGNOSIS — Z8719 Personal history of other diseases of the digestive system: Secondary | ICD-10-CM | POA: Insufficient documentation

## 2014-12-11 DIAGNOSIS — Z720 Tobacco use: Secondary | ICD-10-CM | POA: Insufficient documentation

## 2014-12-11 DIAGNOSIS — F329 Major depressive disorder, single episode, unspecified: Secondary | ICD-10-CM | POA: Insufficient documentation

## 2014-12-11 DIAGNOSIS — H9190 Unspecified hearing loss, unspecified ear: Secondary | ICD-10-CM | POA: Insufficient documentation

## 2014-12-11 DIAGNOSIS — F419 Anxiety disorder, unspecified: Secondary | ICD-10-CM | POA: Insufficient documentation

## 2014-12-11 DIAGNOSIS — Z79899 Other long term (current) drug therapy: Secondary | ICD-10-CM | POA: Insufficient documentation

## 2014-12-11 DIAGNOSIS — M62830 Muscle spasm of back: Secondary | ICD-10-CM | POA: Insufficient documentation

## 2014-12-11 DIAGNOSIS — S39012A Strain of muscle, fascia and tendon of lower back, initial encounter: Secondary | ICD-10-CM | POA: Insufficient documentation

## 2014-12-11 DIAGNOSIS — X58XXXA Exposure to other specified factors, initial encounter: Secondary | ICD-10-CM | POA: Insufficient documentation

## 2014-12-11 MED ORDER — CYCLOBENZAPRINE HCL 10 MG PO TABS: 10.0000 mg | ORAL_TABLET | Freq: Two times a day (BID) | ORAL | Status: AC | PRN

## 2014-12-11 NOTE — Discharge Instructions (Signed)
Continue the ibuprofen you have. Do not drive wile taking the muscle relaxant as it will make you sleepy. Follow up with Dr. Romeo AppleHarrison if symptoms worsen. Return here as needed.

## 2014-12-11 NOTE — ED Provider Notes (Signed)
CSN: [id number]     Arrival date & time 12/11/14  1754 History  This chart was scribed for non-physician practitioner Kerrie BuffaloHope Neese, NP, working with Gilda Creasehristopher J Pollina, MD by Littie Deedsichard Sun, ED Scribe. This patient was seen in room APFT23/APFT23 and the patient's care was started at 7:08 PM.      Chief Complaint  Patient presents with  . Back Pain   Patient is a 24 y.o. female presenting with back pain. The history is provided by the patient. No language interpreter was used.  Back Pain Location:  Thoracic spine and lumbar spine Radiates to:  Does not radiate Onset quality:  Gradual Progression:  Worsening Chronicity:  New Context: physical stress   Relieved by:  Nothing Worsened by:  Ambulation and standing Ineffective treatments:  Ibuprofen Associated symptoms: no abdominal pain, no bladder incontinence, no bowel incontinence and no dysuria    HPI Comments: Rachel Meadows is a 24 y.o. female who presents to the Emergency Department complaining of gradual onset, throbbing, non-radiating mid and lower back pain that started this past September (7 months ago) after giving birth to her daughter, but has worsened recently. She attributes this to overworking. Patient works at [employer] and has worked for 14 days in a row after just getting hired. She has tried ibuprofen but without relief. Patient denies urinary symptoms, flank pain, and loss of bowel or bladder control. She does have hx of kidney stones, but her pain does not feel like a kidney stone.   Past Medical History  Diagnosis Date  . Depression     panic attacks  . Anxiety   . Hearing loss   . GERD (gastroesophageal reflux disease)   . Kidney stones   . Pregnant    Past Surgical History  Procedure Laterality Date  . Cholecystectomy    . Anoidectomy      adenoidectomy  . Tonsillectomy    . External ear surgery Bilateral   . Cesarean section N/A 05/21/2014    Procedure: CESAREAN SECTION;  Surgeon: Adam PhenixJames G Arnold, MD;   Location: [facility];  Service: Obstetrics;  Laterality: N/A;   Family History  Problem Relation Age of Onset  . Kidney failure Brother    History  Substance Use Topics  . Smoking status: Current Every Day Smoker -- 0.50 packs/day for 7 years    Types: Cigarettes  . Smokeless tobacco: Never Used  . Alcohol Use: No   OB History    Gravida Para Term Preterm AB TAB SAB Ectopic Multiple Living   3 3 2 1      3      Review of Systems  Gastrointestinal: Negative for abdominal pain, diarrhea, constipation and bowel incontinence.  Genitourinary: Negative.  Negative for bladder incontinence, dysuria, frequency, hematuria, flank pain and enuresis.  Musculoskeletal: Positive for back pain.  all other systems negative    Allergies  Review of patient's allergies indicates no known allergies.  Home Medications   Prior to Admission medications   Medication Sig Start Date End Date Taking? Authorizing Provider  ALPRAZolam Prudy Feeler(XANAX) 0.5 MG tablet Take 0.5 tablets (0.25 mg total) by mouth 3 (three) times daily as needed for anxiety. 11/15/14   Lazaro ArmsLuther H Eure, MD  cyclobenzaprine (FLEXERIL) 10 MG tablet Take 1 tablet (10 mg total) by mouth 2 (two) times daily as needed for muscle spasms. 12/11/14   Hope Orlene OchM Neese, NP  HYDROcodone-acetaminophen (NORCO/VICODIN) 5-325 MG per tablet Take 1 tablet by mouth every 8 (eight) hours as needed  for moderate pain. 11/15/14   Vickki Hearing, MD  ibuprofen (ADVIL,MOTRIN) 800 MG tablet Take 1 tablet (800 mg total) by mouth every 8 (eight) hours as needed. 09/20/14   Vickki Hearing, MD  zolpidem (AMBIEN) 10 MG tablet Take 1 tablet (10 mg total) by mouth at bedtime as needed for sleep. 11/15/14 01/02/15  Lazaro Arms, MD   BP 151/84 mmHg  Pulse 90  Temp(Src) 98.1 F (36.7 C) (Oral)  Resp 16  Ht 5' (1.524 m)  Wt 157 lb 14.4 oz (71.623 kg)  BMI 30.84 kg/m2  SpO2 100%  LMP 11/10/2014 (Approximate)  Breastfeeding? No Physical Exam  Constitutional: She is oriented  to person, place, and time. She appears well-developed and well-nourished. No distress.  HENT:  Head: Normocephalic and atraumatic.  Right Ear: Tympanic membrane normal.  Left Ear: Tympanic membrane normal.  Nose: Nose normal.  Mouth/Throat: Uvula is midline, oropharynx is clear and moist and mucous membranes are normal.  Eyes: Conjunctivae and EOM are normal.  Neck: Normal range of motion. Neck supple.  Cardiovascular: Normal rate and regular rhythm.   Pulmonary/Chest: Effort normal. She has no wheezes. She has no rales.  Abdominal: Soft. Bowel sounds are normal. There is no tenderness.  Musculoskeletal: Normal range of motion. She exhibits tenderness.       Lumbar back: She exhibits tenderness, pain and spasm. She exhibits normal pulse.  Paraspinal tenderness in the thoracic and lumbar area, no sciatic pain. Some muscle spasm.   Neurological: She is alert and oriented to person, place, and time. She has normal strength. No cranial nerve deficit or sensory deficit. Gait normal.  Reflex Scores:      Bicep reflexes are 2+ on the right side and 2+ on the left side.      Brachioradialis reflexes are 2+ on the right side and 2+ on the left side.      Patellar reflexes are 2+ on the right side and 2+ on the left side.      Achilles reflexes are 2+ on the right side and 2+ on the left side. Steady gait, no foot drag.  Skin: Skin is warm and dry.  Psychiatric: She has a normal mood and affect. Her behavior is normal.  Nursing note and vitals reviewed.   ED Course  Procedures  DIAGNOSTIC STUDIES: Oxygen Saturation is 100% on room air, normal by my interpretation.    COORDINATION OF CARE: 7:13 PM-Discussed treatment plan which includes muscle relaxer, orthopedic referral with pt at bedside and pt agreed to plan. Will give patient work note.    MDM  24 y.o. female with thoracic and lumbar pain after working 14 days straight at [employer]. Stable for d/c without any red flags to indicate  need for immediate neuro consult. Will treat for muscle spasm and inflammation she will follow up with ortho if symptoms persist.    Final diagnoses:  Lumbosacral strain, initial encounter  Thoracic myofascial strain, initial encounter   I personally performed the services described in this documentation, which was scribed in my presence. The recorded information has been reviewed and is accurate.   [place], NP 12/12/14 0149  Gilda Crease, MD 12/13/14 [phone]

## 2014-12-11 NOTE — ED Notes (Signed)
Pt presents to ED complaining of upper back pain that radiates towards the middle back area starting after the birth of her daughter in September.  This has been treated with 800 mg Ibuprofen with minimal relief.  She states that the pain is worse today than normal.  Her pain is rated as 10/10 and described as throbbing in her back.  Denies shortness of breath, dizziness, nausea, vomiting.  She states that she has recently worked 14 days in a row and feels that all the time on her feet at work has aggravated her back condition.  She last took an 800 mg Ibuprofen at 1700 today. No relief.

## 2014-12-13 IMAGING — US US FETAL BPP W/O NONSTRESS
1 series · 13 of 28 positions shown · non-contrast
Comparison: none

[Series 1: us fetal bpp w/o nonstress · non-contrast · 30 acquisitions, 13 frames shown]
[im 2/30]
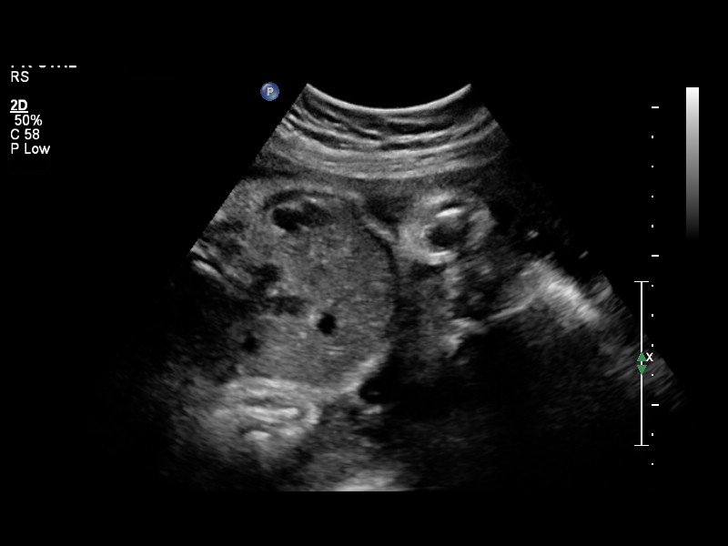
[im 4/30]
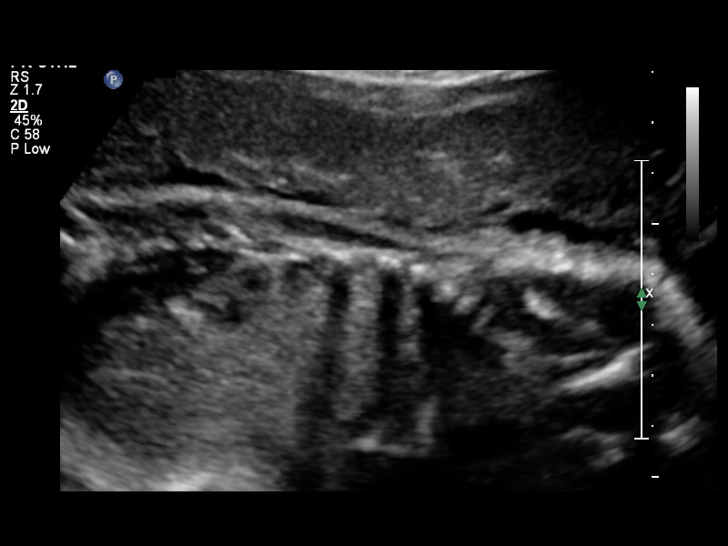
[im 6/30]
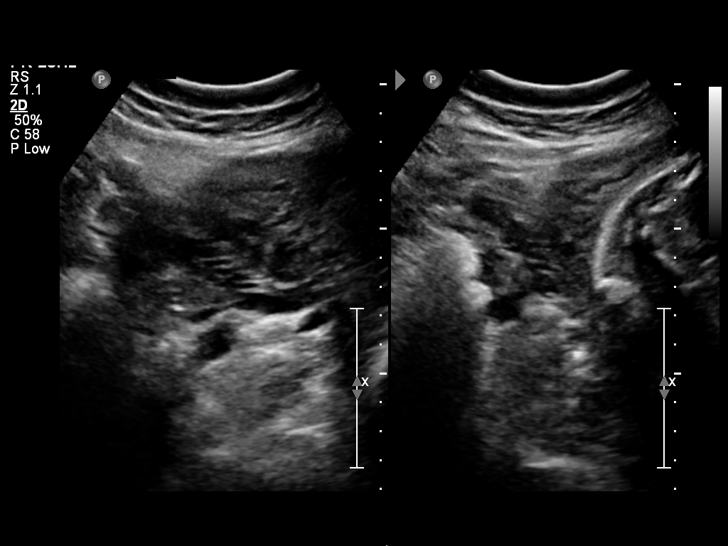
[im 8/30]
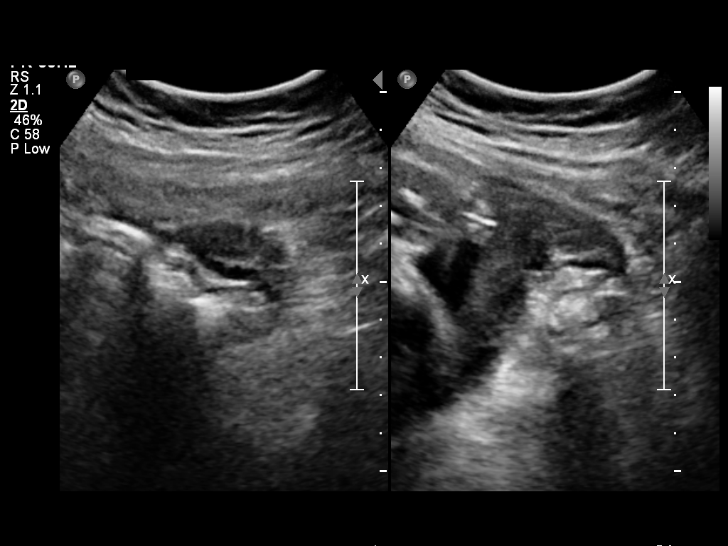
[im 10/30]
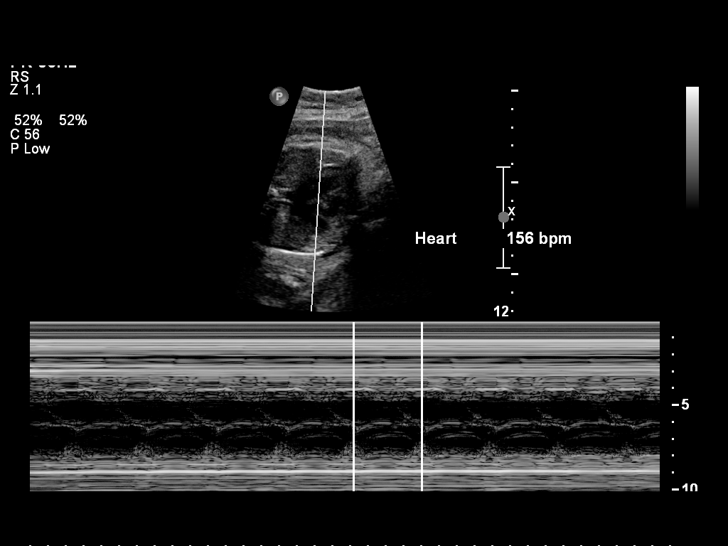
[im 12/30]
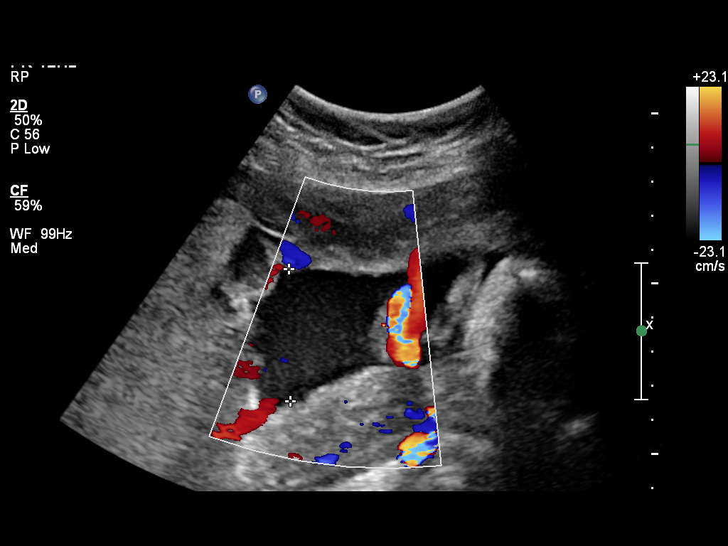
[im 16/30]
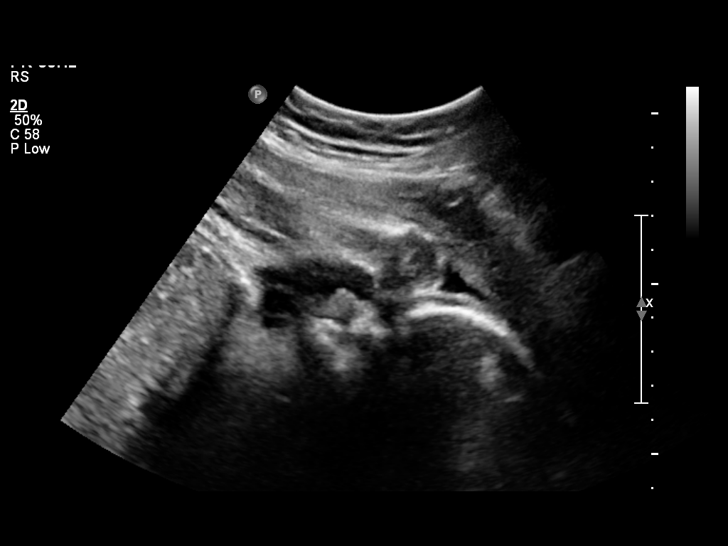
[im 18/30]
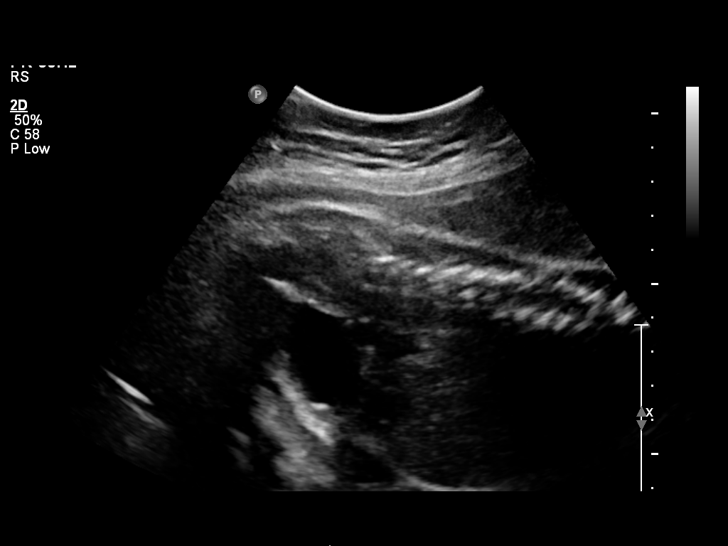
[im 20/30]
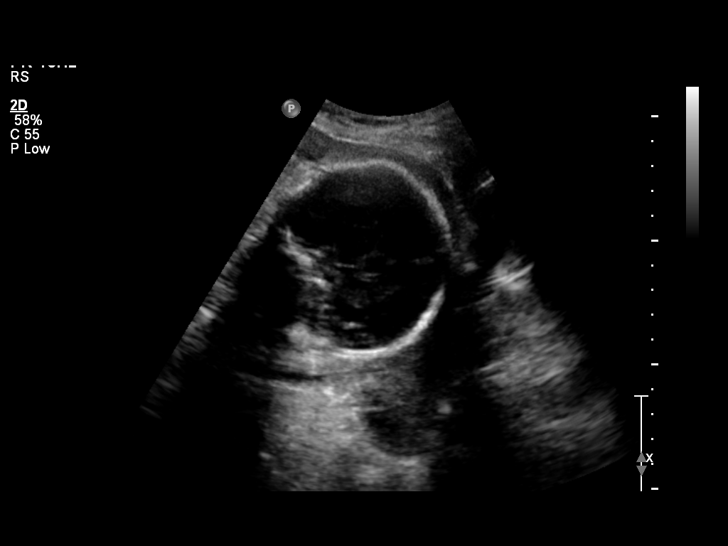
[im 22/30]
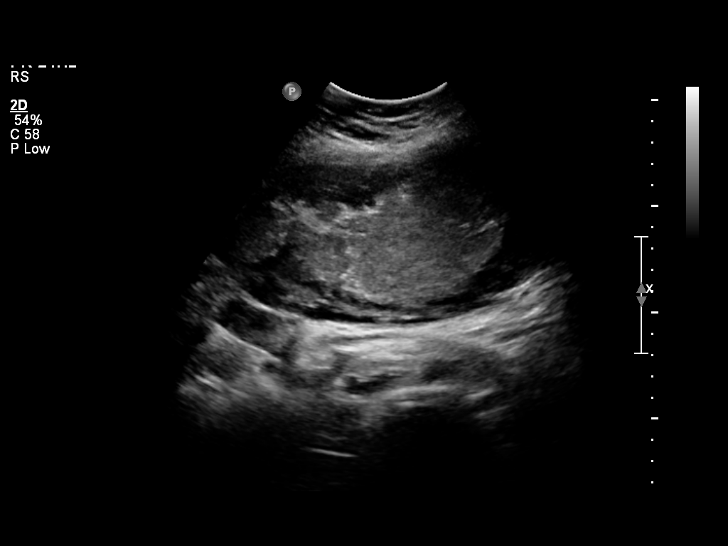
[im 24/30]
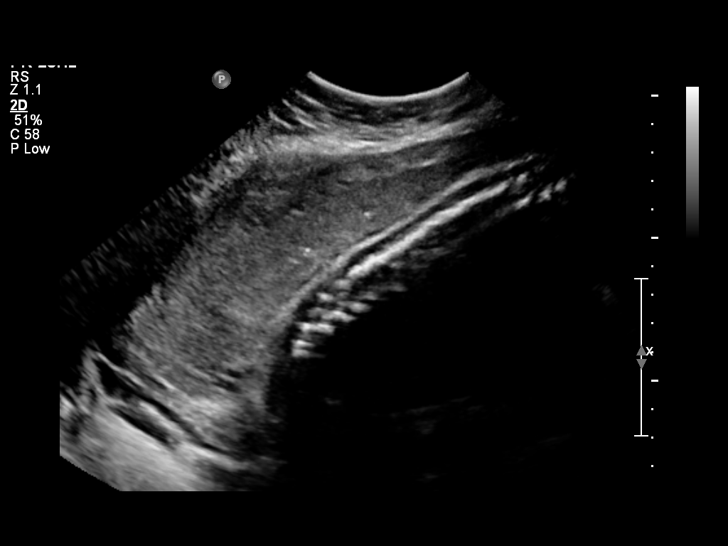
[im 26/30]
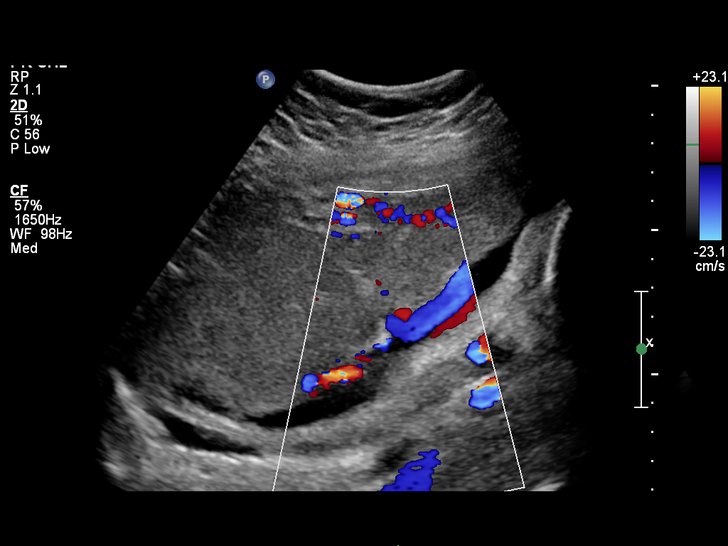
[im 28/30]
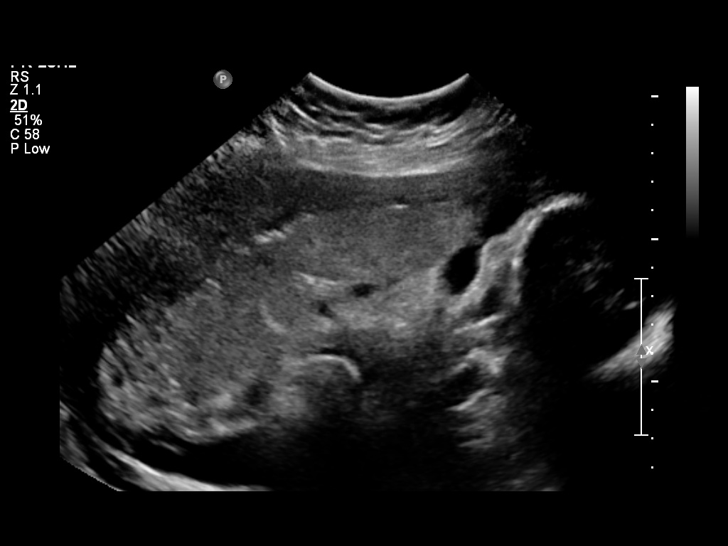

[13 of 28 positions shown; findings below may reference images not displayed]

OBSTETRICS REPORT
                      (Signed Final 05/09/2014 [DATE])

Service(s) Provided

 [HOSPITAL]                                         76815.0
Indications

 Premature rupture of membranes - leaking fluid
 (+Amnisure)
 High risk pregnancy due to maternal drug abuse        648.40, 305.90,
 (marijuana, opiates)
Fetal Evaluation

 Num Of Fetuses:    1
 Fetal Heart Rate:  156                          bpm
 Cardiac Activity:  Observed
 Presentation:      Cephalic
 Placenta:          Anterior Fundal, above
                    cervical os
 P. Cord            Not well visualized
 Insertion:

 Amniotic Fluid
 AFI FV:      Subjectively within normal limits
 AFI Sum:     11.45   cm       25  %Tile     Larg Pckt:    3.86  cm
 RUQ:   2.71    cm   RLQ:    3.86   cm    LUQ:   3.43    cm   LLQ:    1.45   cm
Biophysical Evaluation

 Amniotic F.V:   Within normal limits       F. Tone:         Observed
 F. Movement:    Observed                   Score:           [DATE]
 F. Breathing:   Observed
Gestational Age

 Best:          30w 1d     Det. By:  Clinical EDD             EDD:   07/17/14
Cervix Uterus Adnexa

 Cervix:       Not visualized (advanced GA >11wks)
 Uterus:       No abnormality visualized.
 Cul De Sac:   No free fluid seen.
 Left Ovary:    Within normal limits.
 Right Ovary:   Within normal limits.
 Adnexa:     No abnormality visualized.
Impression

 Single living intrauterine pregnancy at 30 weeks 1day.
 Normal amniotic fluid volume.
 BPP [DATE].
Recommendations

 Follow-up ultrasounds as clinically indicated.

 questions or concerns.
                Docter, Tesfag[place]ish

## 2014-12-22 DIAGNOSIS — Z029 Encounter for administrative examinations, unspecified: Secondary | ICD-10-CM

## 2015-02-02 IMAGING — CT CT ABD-PELV W/ CM
1 of 2 series · 15 of 32 positions shown, 19 images · IV contrast (OMNIPAQUE)
Comparison: 06/16/2014

CLINICAL DATA: Right abdominal pain, leukocytosis, C-section
05/21/2014. History of kidney stones.

EXAM:
CT ABDOMEN AND PELVIS WITH CONTRAST
TECHNIQUE: Multidetector CT imaging of the abdomen and pelvis was performed
using the standard protocol following bolus administration of
intravenous contrast.
CONTRAST:  100mL OMNIPAQUE IOHEXOL 300 MG/ML  SOLN

[Series 2: routine abdomen/pelvis with · axial · 0.65mm/px · z∈[+688,+1074]mm · 15 of 85 slices shown, 19 images]
[im 4/85  soft-tissue]
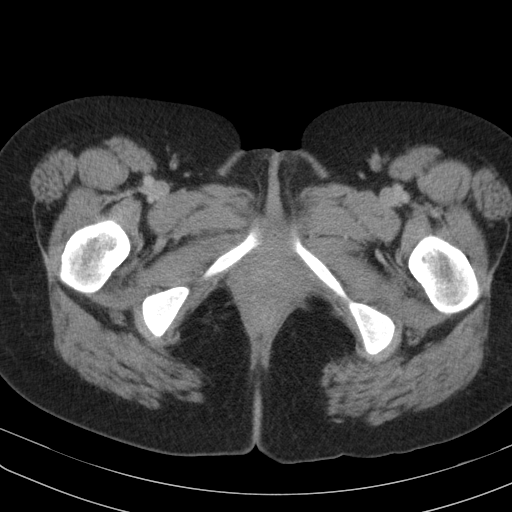
[im 4/85  bone]
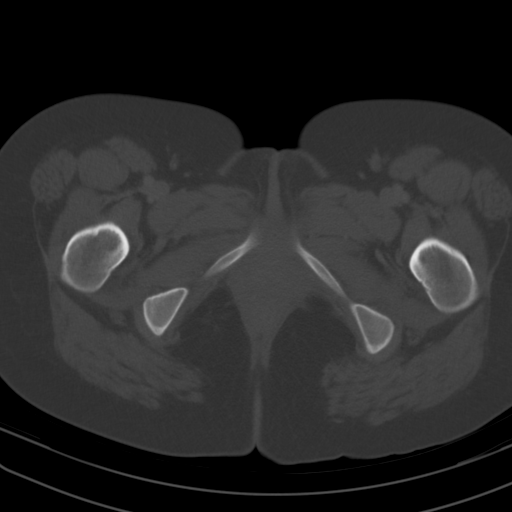
[im 10/85  soft-tissue]
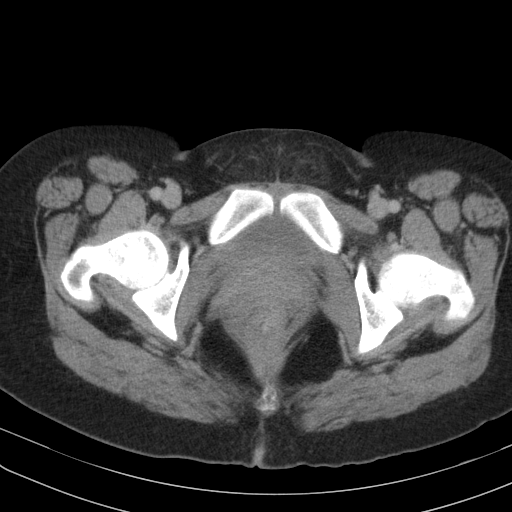
[im 17/85  soft-tissue]
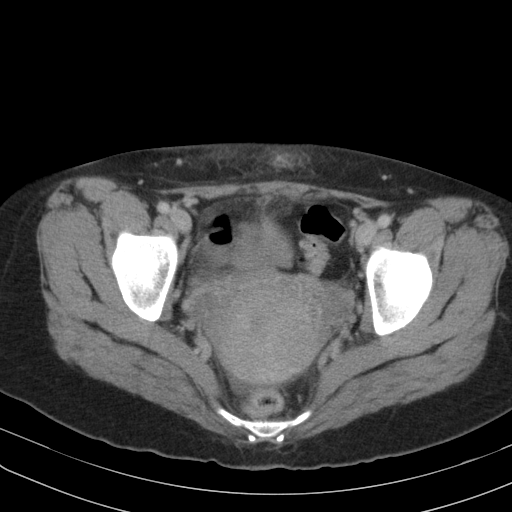
[im 23/85  soft-tissue]
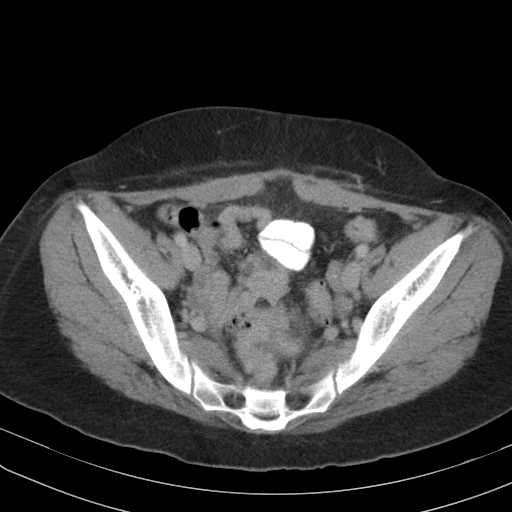
[im 30/85  soft-tissue]
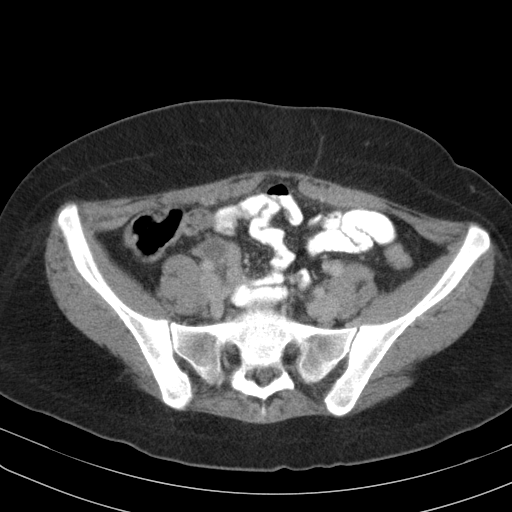
[im 36/85  soft-tissue]
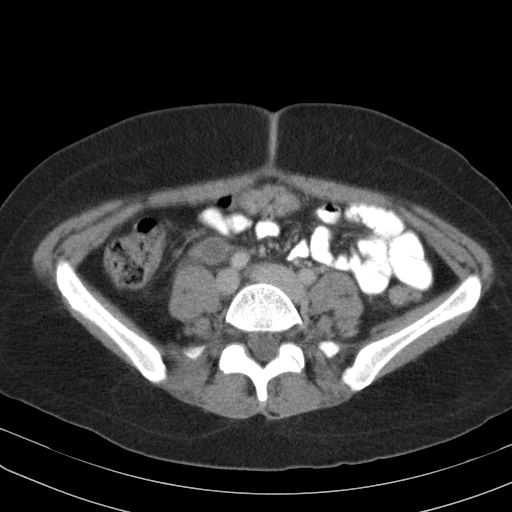
[im 43/85  soft-tissue]
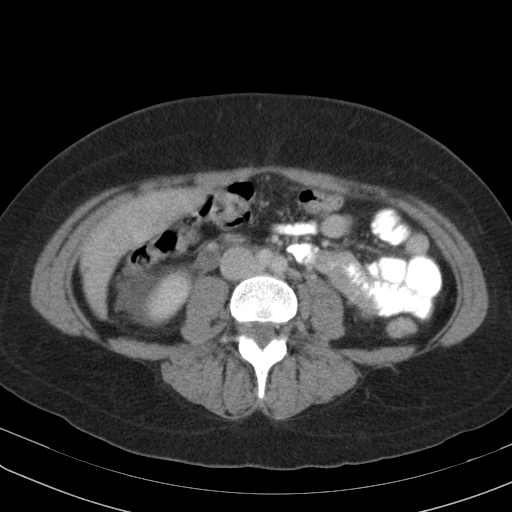
[im 49/85  soft-tissue]
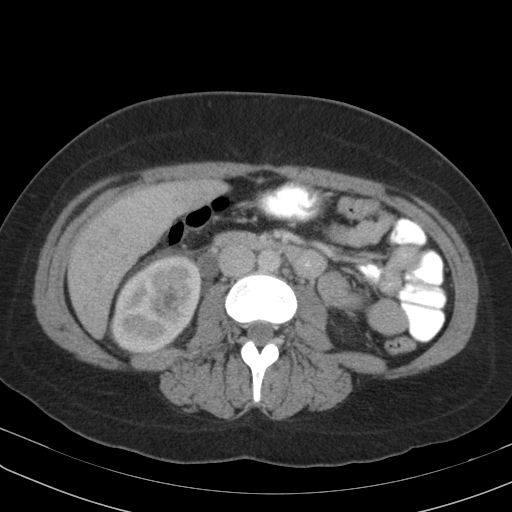
[im 55/85  soft-tissue]
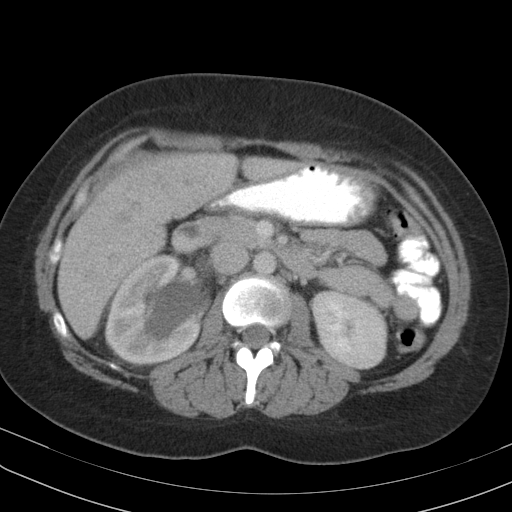
[im 55/85  bone]
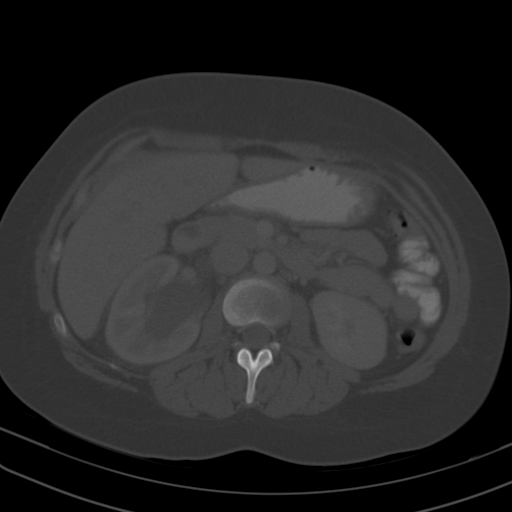
[im 62/85  soft-tissue]
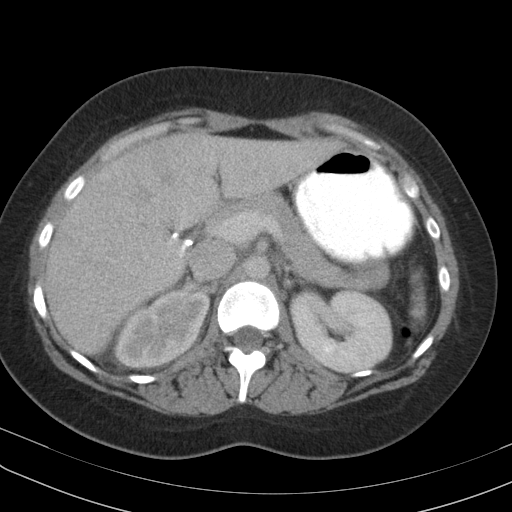
[im 68/85  soft-tissue]
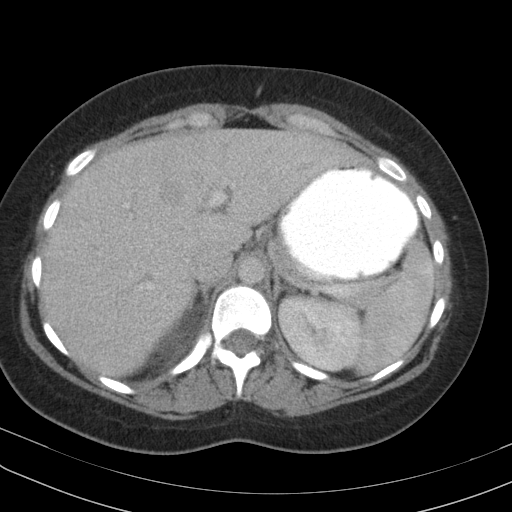
[im 72/85  lung]
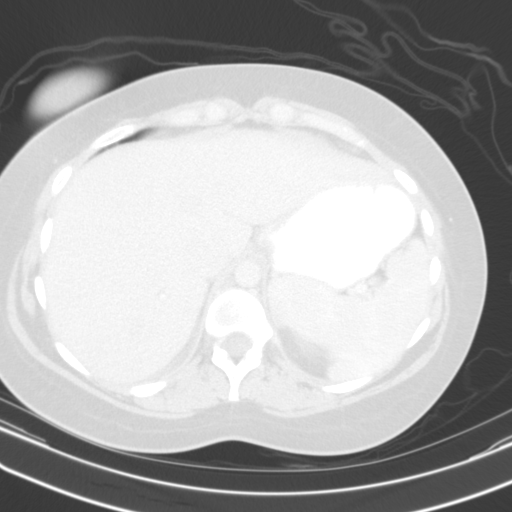
[im 75/85  soft-tissue]
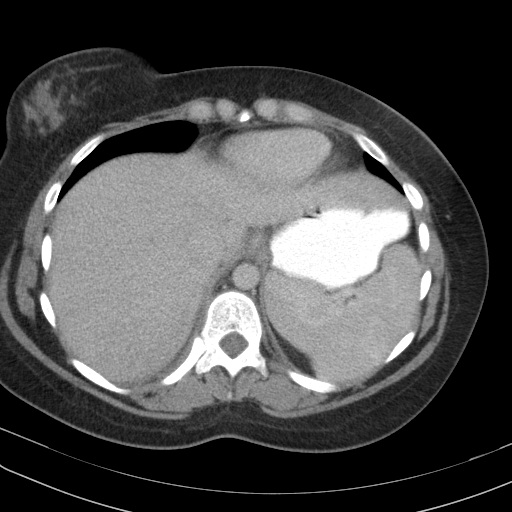
[im 75/85  lung]
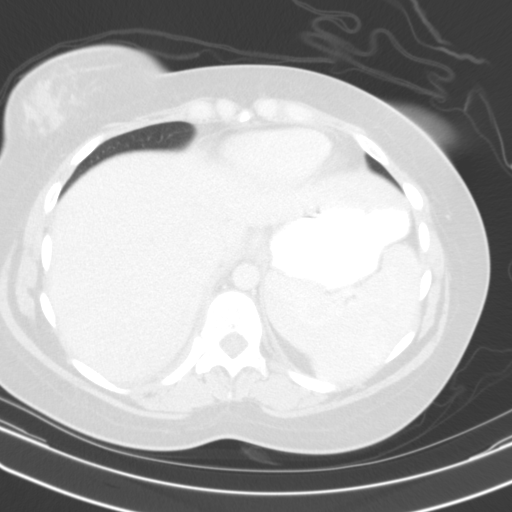
[im 78/85  lung]
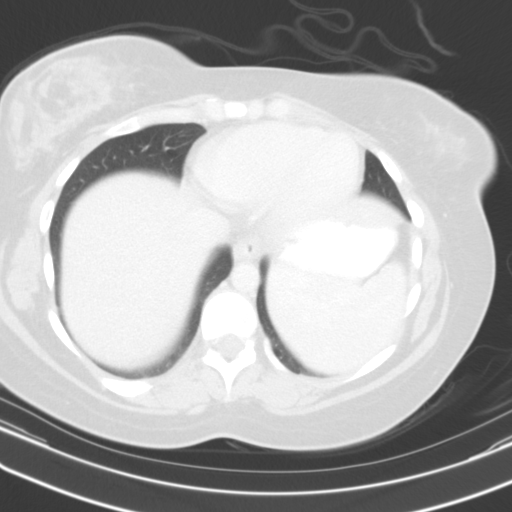
[im 81/85  soft-tissue]
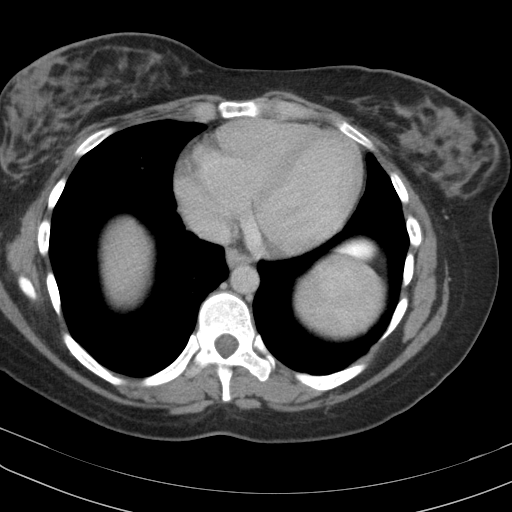
[im 81/85  lung]
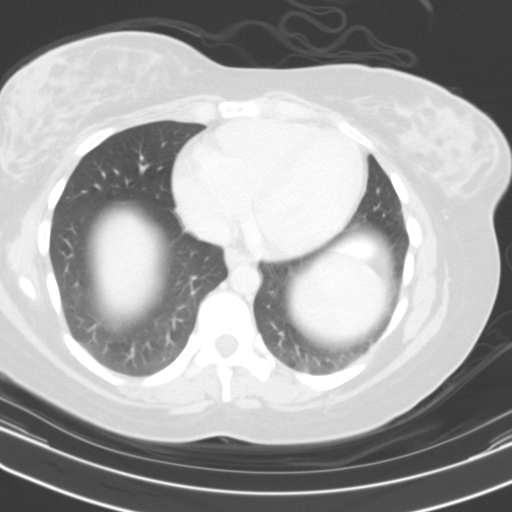

[15 of 32 positions shown; findings below may reference images not displayed]

FINDINGS: Mild ground-glass opacities at the lung bases, favored to reflect
atelectasis. Normal heart size.

Heterogeneous hepatic and splenic attenuation may be accentuated by
contrast bolus timing. Hepatic steatosis not excluded.
Cholecystectomy. No biliary ductal dilatation. No appreciable
abnormality of the pancreas, adrenal glands, left kidney.

Delayed right renal enhancement. Right perinephric fat stranding and
ill-defined fluid. Moderate to severe hydroureteronephrosis to the
level of an 8 mm right UVJ stone. No loculated fluid collection.

No overt colitis. Decompressed appendix. Small bowel loops are of
normal course and caliber. No free intraperitoneal air. No
lymphadenopathy.

Normal caliber aorta and branch vessels.

Thin walled bladder. Similar appearance to the uterus, recently
postpartum. Free fluid within the pelvis. No adnexal mass.

No acute osseous finding. Changes in the anterior abdominal wall in
keeping with recent C-section.
IMPRESSION: Moderate to severe right hydroureteronephrosis to the level of an 8
mm right UVJ stone.

Right periureteral and perinephric fat stranding/ill-defined fluid
may reflect superimposed infection/ pyelonephritis.

Heterogeneous hepatic attenuation may be accentuated by contrast
bolus timing. Steatosis or hepatitis not excluded.

## 2015-02-03 ENCOUNTER — Ambulatory Visit: Payer: Medicaid Other | Admitting: Obstetrics & Gynecology

## 2015-02-07 ENCOUNTER — Ambulatory Visit: Payer: Medicaid Other | Admitting: Obstetrics & Gynecology

## 2015-03-28 ENCOUNTER — Telehealth: Payer: Self-pay | Admitting: Obstetrics & Gynecology

## 2015-03-28 MED ORDER — ALPRAZOLAM 0.5 MG PO TABS: 0.2500 mg | ORAL_TABLET | Freq: Three times a day (TID) | ORAL | Status: DC | PRN

## 2015-03-28 NOTE — Telephone Encounter (Signed)
Pt requesting refill on Xanax. 

## 2015-04-01 ENCOUNTER — Emergency Department (HOSPITAL_COMMUNITY)
Admission: EM | Admit: 2015-04-01 | Discharge: 2015-04-01 | Disposition: A | Discharge status: Home / Self Care | Payer: Medicaid Other | Attending: Emergency Medicine | Admitting: Emergency Medicine

## 2015-04-01 ENCOUNTER — Encounter (HOSPITAL_COMMUNITY): Payer: Self-pay | Admitting: Emergency Medicine

## 2015-04-01 DIAGNOSIS — H9190 Unspecified hearing loss, unspecified ear: Secondary | ICD-10-CM | POA: Insufficient documentation

## 2015-04-01 DIAGNOSIS — F329 Major depressive disorder, single episode, unspecified: Secondary | ICD-10-CM | POA: Insufficient documentation

## 2015-04-01 DIAGNOSIS — F410 Panic disorder [episodic paroxysmal anxiety] without agoraphobia: Secondary | ICD-10-CM | POA: Insufficient documentation

## 2015-04-01 DIAGNOSIS — Z87442 Personal history of urinary calculi: Secondary | ICD-10-CM | POA: Insufficient documentation

## 2015-04-01 DIAGNOSIS — K029 Dental caries, unspecified: Secondary | ICD-10-CM | POA: Insufficient documentation

## 2015-04-01 DIAGNOSIS — Z720 Tobacco use: Secondary | ICD-10-CM | POA: Insufficient documentation

## 2015-04-01 MED ORDER — HYDROCODONE-ACETAMINOPHEN 5-325 MG PO TABS: 1.0000 | ORAL_TABLET | Freq: Four times a day (QID) | ORAL | Status: AC | PRN

## 2015-04-01 MED ORDER — HYDROCODONE-ACETAMINOPHEN 5-325 MG PO TABS
1.0000 | ORAL_TABLET | Freq: Once | ORAL | Status: AC
  Administered 2015-04-01: 1 via ORAL
  Filled 2015-04-01: qty 1

## 2015-04-01 MED ORDER — PENICILLIN V POTASSIUM 500 MG PO TABS: 500.0000 mg | ORAL_TABLET | Freq: Four times a day (QID) | ORAL | Status: AC

## 2015-04-01 MED ORDER — PENICILLIN V POTASSIUM 250 MG PO TABS
500.0000 mg | ORAL_TABLET | Freq: Once | ORAL | Status: AC
  Administered 2015-04-01: 500 mg via ORAL
  Filled 2015-04-01: qty 2

## 2015-04-01 NOTE — ED Provider Notes (Signed)
CSN: [id number]     Arrival date & time 04/01/15  0515 History   First MD Initiated Contact with Patient 04/01/15 [phone]     Chief Complaint  Patient presents with  . Dental Pain     (Consider location/radiation/quality/duration/timing/severity/associated sxs/prior Treatment) HPI patient reports when she had Medicaid she had 3 teeth with large cavities extracted. The tooth is bothering her now with the next 1 they were going to remove however she lost her insurance. She states she's had a large cavity in that tooth for several months. She complains of the right lower molar as being painful. She states 2 days ago it started getting painful. She denies fever. She states ice and cold things makes the pain worse. She states laying a warm compress on her face makes the pain feel better. Oral gel has not helped with the pain. She had some leftover amoxicillin she took 3 of the tablets 2 days ago and then 2 yesterday and ran out. She states she talked to the health department in [place] and they want $180 just to x-ray the tooth.  PCP none GYN Dr Despina Hidden  Past Medical History  Diagnosis Date  . Depression     panic attacks  . Anxiety   . Hearing loss   . GERD (gastroesophageal reflux disease)   . Kidney stones   . Pregnant    Past Surgical History  Procedure Laterality Date  . Cholecystectomy    . Anoidectomy      adenoidectomy  . Tonsillectomy    . External ear surgery Bilateral   . Cesarean section N/A 05/21/2014    Procedure: CESAREAN SECTION;  Surgeon: Adam Phenix, MD;  Location: [facility];  Service: Obstetrics;  Laterality: N/A;   Family History  Problem Relation Age of Onset  . Kidney failure Brother    History  Substance Use Topics  . Smoking status: Current Every Day Smoker -- 0.50 packs/day for 7 years    Types: Cigarettes  . Smokeless tobacco: Never Used  . Alcohol Use: No   employed  OB History    Gravida Para Term Preterm AB TAB SAB Ectopic Multiple Living   Review of Systems  All other systems reviewed and are negative.     Allergies  Review of patient's allergies indicates no known allergies.  Home Medications   Prior to Admission medications   Medication Sig Start Date End Date Taking? Authorizing Provider  ALPRAZolam Prudy Feeler) 0.5 MG tablet Take 0.5 tablets (0.25 mg total) by mouth 3 (three) times daily as needed for anxiety. 03/28/15   Lazaro Arms, MD  cyclobenzaprine (FLEXERIL) 10 MG tablet Take 1 tablet (10 mg total) by mouth 2 (two) times daily as needed for muscle spasms. 12/11/14   Hope Orlene Och, NP  ibuprofen (ADVIL,MOTRIN) 800 MG tablet Take 1 tablet (800 mg total) by mouth every 8 (eight) hours as needed. 09/20/14   Vickki Hearing, MD  zolpidem (AMBIEN) 10 MG tablet Take 1 tablet (10 mg total) by mouth at bedtime as needed for sleep. 11/15/14 01/02/15  Lazaro Arms, MD   BP 138/79 mmHg  Pulse 54  Temp(Src) 97.7 F (36.5 C)  Resp 16  SpO2 98%  LMP 03/20/2015 (Approximate)  Vital signs normal except for bradycardia  Physical Exam  Constitutional: She is oriented to person, place, and time. She appears well-developed and well-nourished.  Non-toxic appearance. She does not  appear ill. No distress.  HENT:  Head: Normocephalic and atraumatic.  Right Ear: External ear normal.  Left Ear: External ear normal.  Nose: Nose normal. No mucosal edema or rhinorrhea.  Mouth/Throat: Oropharynx is clear and moist and mucous membranes are normal. No dental abscesses or uvula swelling.    Voice normal, no swelling of face or neck. No enlarged lymph nodes  Eyes: Conjunctivae and EOM are normal. Pupils are equal, round, and reactive to light.  Neck: Normal range of motion and full passive range of motion without pain. Neck supple.  Pulmonary/Chest: She has no rhonchi. She exhibits no crepitus.  Abdominal: Soft. Normal appearance and bowel sounds are normal. She exhibits no distension. There is no tenderness. There is no  rebound and no guarding.  Musculoskeletal: Normal range of motion. She exhibits no edema or tenderness.  Moves all extremities well.   Neurological: She is alert and oriented to person, place, and time. She has normal strength. No cranial nerve deficit.  Skin: Skin is warm, dry and intact. No rash noted. No erythema. No pallor.  Psychiatric: She has a normal mood and affect. Her speech is normal and behavior is normal. Her mood appears not anxious.  Nursing note and vitals reviewed.   ED Course  Procedures (including critical care time)  Medications  HYDROcodone-acetaminophen (NORCO/VICODIN) 5-325 MG per tablet 1 tablet (1 tablet Oral Given 04/01/15 0606)  penicillin v potassium (VEETID) tablet 500 mg (500 mg Oral Given 04/01/15 0606)   Patient was started on Pen-Vee K and Norco for pain. We discussed getting definitive care of her tooth including trying the denture services places which will sometimes extract teeth for a reduced cost.  Review of the [place] database shows patient gets #90 alprazolam 0.5 mg tablets monthly in March, May, and twice in June from Dr. Despina Hidden, she has gotten 4 hydrocodone prescriptions from Dr. Romeo Apple #56 in February, #40 in March, #20 again in March, and #30 again in March.  Labs Review Labs Reviewed - No data to display  Imaging Review No results found.   EKG Interpretation None      MDM   Final diagnoses:  Dental caries   Discharge Medication List as of 04/01/2015  5:58 AM    START taking these medications   Details  penicillin v potassium (VEETID) 500 MG tablet Take 1 tablet (500 mg total) by mouth 4 (four) times daily., Starting 04/01/2015, Until Sat 04/08/15, Print      Hydrocodone 5-324 mg  Plan discharge  Devoria Albe, MD, Concha Pyo, MD 04/01/15 [phone]

## 2015-04-01 NOTE — ED Notes (Addendum)
Pt. Reports right side dental pain. Pt. Requesting antibiotics for tooth. Pt. Reports taking amoxicillin pills from previous dental procedure. Pt. Reports using orajel without relief.

## 2015-04-01 NOTE — Discharge Instructions (Signed)
Continue using the warm compresses. Take the hydrocodone for pain with ibuprofen 600 mg 4 times a day. Take the antibiotics. Call the denture services in [place], they usually will pull teeth at a discounted rate. Return to the ED if you get a fever or swelling in your face.     Dental Pain A tooth ache may be caused by cavities (tooth decay). Cavities expose the nerve of the tooth to air and hot or cold temperatures. It may come from an infection or abscess (also called a boil or furuncle) around your tooth. It is also often caused by dental caries (tooth decay). This causes the pain you are having. DIAGNOSIS  Your caregiver can diagnose this problem by exam. TREATMENT   If caused by an infection, it may be treated with medications which kill germs (antibiotics) and pain medications as prescribed by your caregiver. Take medications as directed.  Only take over-the-counter or prescription medicines for pain, discomfort, or fever as directed by your caregiver.  Whether the tooth ache today is caused by infection or dental disease, you should see your dentist as soon as possible for further care. SEEK MEDICAL CARE IF: The exam and treatment you received today has been provided on an emergency basis only. This is not a substitute for complete medical or dental care. If your problem worsens or new problems (symptoms) appear, and you are unable to meet with your dentist, call or return to this location. SEEK IMMEDIATE MEDICAL CARE IF:   You have a fever.  You develop redness and swelling of your face, jaw, or neck.  You are unable to open your mouth.  You have severe pain uncontrolled by pain medicine. MAKE SURE YOU:   Understand these instructions.  Will watch your condition.  Will get help right away if you are not doing well or get worse. Document Released: 08/19/2005 Document Revised: 11/11/2011 Document Reviewed: 04/06/2008 [facility] Patient Information 2015 [place], [place]. This  information is not intended to replace advice given to you by your health care provider. Make sure you discuss any questions you have with your health care provider.   Dental Caries Dental caries (also called tooth decay) is the most common oral disease. It can occur at any age but is more common in children and young adults.  HOW DENTAL CARIES DEVELOPS  The process of decay begins when bacteria and foods (particularly sugars and starches) combine in your mouth to produce plaque. Plaque is a substance that sticks to the hard, outer surface of a tooth (enamel). The bacteria in plaque produce acids that attack enamel. These acids may also attack the root surface of a tooth (cementum) if it is exposed. Repeated attacks dissolve these surfaces and create holes in the tooth (cavities). If left untreated, the acids destroy the other layers of the tooth.  RISK FACTORS  Frequent sipping of sugary beverages.   Frequent snacking on sugary and starchy foods, especially those that easily get stuck in the teeth.   Poor oral hygiene.   Dry mouth.   Substance abuse such as methamphetamine abuse.   Broken or poor-fitting dental restorations.   Eating disorders.   Gastroesophageal reflux disease (GERD).   Certain radiation treatments to the head and neck. SYMPTOMS In the early stages of dental caries, symptoms are seldom present. Sometimes white, chalky areas may be seen on the enamel or other tooth layers. In later stages, symptoms may include:  Pits and holes on the enamel.  Toothache after sweet, hot, or  cold foods or drinks are consumed.  Pain around the tooth.  Swelling around the tooth. DIAGNOSIS  Most of the time, dental caries is detected during a regular dental checkup. A diagnosis is made after a thorough medical and dental history is taken and the surfaces of your teeth are checked for signs of dental caries. Sometimes special instruments, such as lasers, are used to check for  dental caries. Dental X-ray exams may be taken so that areas not visible to the eye (such as between the contact areas of the teeth) can be checked for cavities.  TREATMENT  If dental caries is in its early stages, it may be reversed with a fluoride treatment or an application of a remineralizing agent at the dental office. Thorough brushing and flossing at home is needed to aid these treatments. If it is in its later stages, treatment depends on the location and extent of tooth destruction:   If a small area of the tooth has been destroyed, the destroyed area will be removed and cavities will be filled with a material such as gold, silver amalgam, or composite resin.   If a large area of the tooth has been destroyed, the destroyed area will be removed and a cap (crown) will be fitted over the remaining tooth structure.   If the center part of the tooth (pulp) is affected, a procedure called a root canal will be needed before a filling or crown can be placed.   If most of the tooth has been destroyed, the tooth may need to be pulled (extracted). HOME CARE INSTRUCTIONS You can prevent, stop, or reverse dental caries at home by practicing good oral hygiene. Good oral hygiene includes:  Thoroughly cleaning your teeth at least twice a day with a toothbrush and dental floss.   Using a fluoride toothpaste. A fluoride mouth rinse may also be used if recommended by your dentist or health care provider.   Restricting the amount of sugary and starchy foods and sugary liquids you consume.   Avoiding frequent snacking on these foods and sipping of these liquids.   Keeping regular visits with a dentist for checkups and cleanings. PREVENTION   Practice good oral hygiene.  Consider a dental sealant. A dental sealant is a coating material that is applied by your dentist to the pits and grooves of teeth. The sealant prevents food from being trapped in them. It may protect the teeth for several  years.  Ask about fluoride supplements if you live in a community without fluorinated water or with water that has a low fluoride content. Use fluoride supplements as directed by your dentist or health care provider.  Allow fluoride varnish applications to teeth if directed by your dentist or health care provider. Document Released: 05/11/2002 Document Revised: 01/03/2014 Document Reviewed: 08/21/2012 [facility] Patient Information 2015 [place], [place]. This information is not intended to replace advice given to you by your health care provider. Make sure you discuss any questions you have with your health care provider.

## 2015-04-19 ENCOUNTER — Telehealth: Payer: Self-pay | Admitting: Obstetrics & Gynecology

## 2015-04-19 NOTE — Telephone Encounter (Signed)
Requesting refill on Xanax. Pt states pharmacy will not let you refill Xanax due to dosage. Please advise.  Pt does states she is leaving to go to [place] tomorrow and would like to get RX before she leaves.

## 2015-04-20 ENCOUNTER — Telehealth: Payer: Self-pay | Admitting: Obstetrics & Gynecology

## 2015-04-20 MED ORDER — ALPRAZOLAM 0.5 MG PO TABS
0.5000 mg | ORAL_TABLET | Freq: Three times a day (TID) | ORAL | Status: DC | PRN
Start: 2015-04-20 — End: 2015-08-14

## 2015-04-20 NOTE — Telephone Encounter (Signed)
Pt informed Xanax faxed to [place], Jonita Albee.

## 2015-08-14 ENCOUNTER — Other Ambulatory Visit: Payer: Self-pay | Admitting: Obstetrics & Gynecology

## 2015-12-13 ENCOUNTER — Telehealth: Payer: Self-pay | Admitting: *Deleted

## 2015-12-14 ENCOUNTER — Other Ambulatory Visit: Payer: Self-pay | Admitting: Obstetrics & Gynecology

## 2015-12-14 MED ORDER — ALPRAZOLAM 0.5 MG PO TABS: 0.5000 mg | ORAL_TABLET | Freq: Three times a day (TID) | ORAL | Status: DC | PRN

## 2015-12-19 NOTE — Telephone Encounter (Signed)
Done

## 2016-01-09 ENCOUNTER — Telehealth: Payer: Self-pay | Admitting: Obstetrics & Gynecology

## 2016-01-09 NOTE — Telephone Encounter (Signed)
Pt called stating that her medication was [place] a pharmacy in [place] and her plane was de laid. [facility]. Please contact pt

## 2016-01-09 NOTE — Telephone Encounter (Signed)
Pt informed she can call the [facility] in [place] and see if they will transfer her refill for the [facility]ax to the [facility] [place]. Pt verbalized understanding.

## 2016-03-15 ENCOUNTER — Telehealth: Payer: Self-pay | Admitting: *Deleted
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
## 2016-03-18 ENCOUNTER — Telehealth: Payer: Self-pay | Admitting: Obstetrics & Gynecology

## 2016-03-19 ENCOUNTER — Telehealth: Payer: Self-pay | Admitting: Obstetrics & Gynecology

## 2016-03-19 MED ORDER — ALPRAZOLAM 0.5 MG PO TABS: 0.5000 mg | ORAL_TABLET | Freq: Three times a day (TID) | ORAL | Status: AC | PRN

## 2016-03-22 NOTE — Telephone Encounter (Signed)
Attempted to reach pt by phone x 3 attempts.

## 2016-03-22 NOTE — Telephone Encounter (Addendum)
Pt informed [facility] [place] for pick up. Pt also informed will not be able to get anymore refills on Xanax without an appt. Pt verbalized understanding.

## 2019-08-27 ENCOUNTER — Inpatient Hospital Stay (HOSPITAL_COMMUNITY)
Admission: EM | Admit: 2019-08-27 | Discharge: 2019-09-01 | DRG: 872 | Discharge status: Against Medical Advice | Payer: Self-pay | Attending: Internal Medicine | Admitting: Internal Medicine

## 2019-08-27 ENCOUNTER — Emergency Department (HOSPITAL_COMMUNITY): Payer: Self-pay

## 2019-08-27 ENCOUNTER — Other Ambulatory Visit: Payer: Self-pay

## 2019-08-27 DIAGNOSIS — R0781 Pleurodynia: Secondary | ICD-10-CM | POA: Diagnosis present

## 2019-08-27 DIAGNOSIS — F17210 Nicotine dependence, cigarettes, uncomplicated: Secondary | ICD-10-CM | POA: Diagnosis present

## 2019-08-27 DIAGNOSIS — A419 Sepsis, unspecified organism: Secondary | ICD-10-CM | POA: Diagnosis present

## 2019-08-27 DIAGNOSIS — F1590 Other stimulant use, unspecified, uncomplicated: Secondary | ICD-10-CM | POA: Diagnosis present

## 2019-08-27 DIAGNOSIS — A400 Sepsis due to streptococcus, group A: Principal | ICD-10-CM | POA: Diagnosis present

## 2019-08-27 DIAGNOSIS — D6489 Other specified anemias: Secondary | ICD-10-CM | POA: Diagnosis present

## 2019-08-27 DIAGNOSIS — K219 Gastro-esophageal reflux disease without esophagitis: Secondary | ICD-10-CM | POA: Diagnosis present

## 2019-08-27 DIAGNOSIS — W500XXA Accidental hit or strike by another person, initial encounter: Secondary | ICD-10-CM | POA: Diagnosis present

## 2019-08-27 DIAGNOSIS — Z841 Family history of disorders of kidney and ureter: Secondary | ICD-10-CM

## 2019-08-27 DIAGNOSIS — F1113 Opioid abuse with withdrawal: Secondary | ICD-10-CM | POA: Diagnosis not present

## 2019-08-27 DIAGNOSIS — R0602 Shortness of breath: Secondary | ICD-10-CM | POA: Diagnosis present

## 2019-08-27 DIAGNOSIS — Z79899 Other long term (current) drug therapy: Secondary | ICD-10-CM

## 2019-08-27 DIAGNOSIS — F419 Anxiety disorder, unspecified: Secondary | ICD-10-CM | POA: Diagnosis present

## 2019-08-27 DIAGNOSIS — I808 Phlebitis and thrombophlebitis of other sites: Secondary | ICD-10-CM | POA: Diagnosis present

## 2019-08-27 DIAGNOSIS — L03113 Cellulitis of right upper limb: Secondary | ICD-10-CM

## 2019-08-27 DIAGNOSIS — F329 Major depressive disorder, single episode, unspecified: Secondary | ICD-10-CM | POA: Diagnosis present

## 2019-08-27 DIAGNOSIS — Z20828 Contact with and (suspected) exposure to other viral communicable diseases: Secondary | ICD-10-CM | POA: Diagnosis present

## 2019-08-27 LAB — PROTIME-INR
INR: 1 (ref 0.8–1.2)
Prothrombin Time: 13.1 seconds (ref 11.4–15.2)

## 2019-08-27 LAB — CBC WITH DIFFERENTIAL/PLATELET
Abs Immature Granulocytes: 0.05 10*3/uL (ref 0.00–0.07)
Basophils Absolute: 0 10*3/uL (ref 0.0–0.1)
Basophils Relative: 0 %
Eosinophils Absolute: 0 10*3/uL (ref 0.0–0.5)
Eosinophils Relative: 0 %
HCT: 37.9 % (ref 36.0–46.0)
Hemoglobin: 11.9 g/dL — ABNORMAL LOW (ref 12.0–15.0)
Immature Granulocytes: 0 %
Lymphocytes Relative: 9 %
Lymphs Abs: 1.2 10*3/uL (ref 0.7–4.0)
MCH: 29.7 pg (ref 26.0–34.0)
MCHC: 31.4 g/dL (ref 30.0–36.0)
MCV: 94.5 fL (ref 80.0–100.0)
Monocytes Absolute: 0.6 10*3/uL (ref 0.1–1.0)
Monocytes Relative: 5 %
Neutro Abs: 11.1 10*3/uL — ABNORMAL HIGH (ref 1.7–7.7)
Neutrophils Relative %: 86 %
Platelets: 484 10*3/uL — ABNORMAL HIGH (ref 150–400)
RBC: 4.01 MIL/uL (ref 3.87–5.11)
RDW: 12.8 % (ref 11.5–15.5)
WBC: 13 10*3/uL — ABNORMAL HIGH (ref 4.0–10.5)
nRBC: 0 % (ref 0.0–0.2)

## 2019-08-27 LAB — I-STAT BETA HCG BLOOD, ED (MC, WL, AP ONLY): I-stat hCG, quantitative: <5 m[IU]/mL (ref <5)

## 2019-08-27 LAB — COMPREHENSIVE METABOLIC PANEL
ALT: 29 U/L (ref 0–44)
AST: 27 U/L (ref 15–41)
Albumin: 3.7 g/dL (ref 3.5–5.0)
Alkaline Phosphatase: 79 U/L (ref 38–126)
Anion gap: 13 (ref 5–15)
BUN: <5 mg/dL — ABNORMAL LOW (ref 6–20)
CO2: 21 mmol/L — ABNORMAL LOW (ref 22–32)
Calcium: 9.4 mg/dL (ref 8.9–10.3)
Chloride: 98 mmol/L (ref 98–111)
Creatinine, Ser: 0.52 mg/dL (ref 0.44–1.00)
GFR calc Af Amer: >60 mL/min (ref >60)
GFR calc non Af Amer: >60 mL/min (ref >60)
Glucose, Bld: 109 mg/dL — ABNORMAL HIGH (ref 70–99)
Potassium: 3.9 mmol/L (ref 3.5–5.1)
Sodium: 132 mmol/L — ABNORMAL LOW (ref 135–145)
Total Bilirubin: 0.8 mg/dL (ref 0.3–1.2)
Total Protein: 9 g/dL — ABNORMAL HIGH (ref 6.5–8.1)

## 2019-08-27 LAB — LACTIC ACID, PLASMA: Lactic Acid, Venous: 1 mmol/L (ref 0.5–1.9)

## 2019-08-27 MED ORDER — ACETAMINOPHEN 325 MG PO TABS
650.0000 mg | ORAL_TABLET | Freq: Once | ORAL | Status: DC
  Filled 2019-08-27: qty 2
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
## 2019-08-28 ENCOUNTER — Inpatient Hospital Stay (HOSPITAL_COMMUNITY): Payer: Self-pay

## 2019-08-28 DIAGNOSIS — A419 Sepsis, unspecified organism: Secondary | ICD-10-CM | POA: Diagnosis present

## 2019-08-28 DIAGNOSIS — F17210 Nicotine dependence, cigarettes, uncomplicated: Secondary | ICD-10-CM

## 2019-08-28 DIAGNOSIS — R0781 Pleurodynia: Secondary | ICD-10-CM

## 2019-08-28 DIAGNOSIS — L03113 Cellulitis of right upper limb: Secondary | ICD-10-CM

## 2019-08-28 LAB — URINALYSIS, ROUTINE W REFLEX MICROSCOPIC
Bilirubin Urine: NEGATIVE
Glucose, UA: NEGATIVE mg/dL
Hgb urine dipstick: NEGATIVE
Ketones, ur: NEGATIVE mg/dL
Leukocytes,Ua: NEGATIVE
Nitrite: NEGATIVE
Protein, ur: NEGATIVE mg/dL
Specific Gravity, Urine: 1.005 (ref 1.005–1.030)
pH: 7 (ref 5.0–8.0)

## 2019-08-28 LAB — SARS CORONAVIRUS 2 (TAT 6-24 HRS): SARS Coronavirus 2: NEGATIVE

## 2019-08-28 LAB — LACTIC ACID, PLASMA: Lactic Acid, Venous: 0.8 mmol/L (ref 0.5–1.9)

## 2019-08-28 LAB — POC SARS CORONAVIRUS 2 AG -  ED: SARS Coronavirus 2 Ag: NEGATIVE

## 2019-08-28 MED ORDER — VANCOMYCIN HCL IN DEXTROSE 1-5 GM/200ML-% IV SOLN: 1000.0000 mg | Freq: Once | INTRAVENOUS | Status: DC

## 2019-08-28 MED ORDER — POLYETHYLENE GLYCOL 3350 17 G PO PACK: 17.0000 g | PACK | Freq: Every day | ORAL | Status: DC | PRN

## 2019-08-28 MED ORDER — VANCOMYCIN HCL 750 MG/150ML IV SOLN
750.0000 mg | Freq: Two times a day (BID) | INTRAVENOUS | Status: DC
  Administered 2019-08-28 – 2019-08-29 (×3): 750 mg via INTRAVENOUS
  Filled 2019-08-28 (×5): qty 150

## 2019-08-28 MED ORDER — ONDANSETRON HCL 4 MG PO TABS: 4.0000 mg | ORAL_TABLET | Freq: Four times a day (QID) | ORAL | Status: DC | PRN

## 2019-08-28 MED ORDER — ACETAMINOPHEN 325 MG PO TABS
650.0000 mg | ORAL_TABLET | Freq: Four times a day (QID) | ORAL | Status: DC | PRN
  Administered 2019-08-28 – 2019-08-29 (×2): 650 mg via ORAL
  Filled 2019-08-28 (×2): qty 2

## 2019-08-28 MED ORDER — SODIUM CHLORIDE 0.9 % IV SOLN: INTRAVENOUS | Status: DC

## 2019-08-28 MED ORDER — SODIUM CHLORIDE 0.9 % IV BOLUS
1000.0000 mL | Freq: Once | INTRAVENOUS | Status: AC
  Administered 2019-08-28: 11:00:00 1000 mL via INTRAVENOUS

## 2019-08-28 MED ORDER — IOHEXOL 300 MG/ML  SOLN
100.0000 mL | Freq: Once | INTRAMUSCULAR | Status: AC | PRN
  Administered 2019-08-28: 100 mL via INTRAVENOUS

## 2019-08-28 MED ORDER — ENOXAPARIN SODIUM 40 MG/0.4ML ~~LOC~~ SOLN
40.0000 mg | SUBCUTANEOUS | Status: DC
  Administered 2019-08-31: 14:00:00 40 mg via SUBCUTANEOUS
  Filled 2019-08-28 (×2): qty 0.4

## 2019-08-28 MED ORDER — ACETAMINOPHEN 500 MG PO TABS
1000.0000 mg | ORAL_TABLET | Freq: Once | ORAL | Status: AC
  Administered 2019-08-28: 1000 mg via ORAL
  Filled 2019-08-28: qty 2

## 2019-08-28 MED ORDER — CEFAZOLIN SODIUM-DEXTROSE 1-4 GM/50ML-% IV SOLN
1.0000 g | Freq: Once | INTRAVENOUS | Status: AC
  Administered 2019-08-28: 11:00:00 1 g via INTRAVENOUS
  Filled 2019-08-28: qty 50

## 2019-08-28 MED ORDER — ONDANSETRON HCL 4 MG/2ML IJ SOLN: 4.0000 mg | Freq: Four times a day (QID) | INTRAMUSCULAR | Status: DC | PRN

## 2019-08-28 NOTE — ED Notes (Signed)
This RN attempted to give report to 5N02, Caren Griffins stated that it would be better to wait d/t it being 8 minutes until huddle for shift change.

## 2019-08-28 NOTE — Progress Notes (Signed)
Gram positive cocci in chains in 1/4 bottles of blood cx (anaerobic)  No BCID done at this point d/t shortage  Discussed with Dr Benjamine Mola  No changes for now  Barth Kirks, PharmD, BCPS, BCCCP Clinical Pharmacist [phone]  Please check AMION for all [place] numbers  08/28/2019 2:22 PM

## 2019-08-28 NOTE — ED Notes (Signed)
Pt is a hard stick, her only IV will not pull back & the attempts to draw her remianing labs have been unsuccessful.
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
## 2019-08-28 NOTE — ED Notes (Signed)
Pt woke up from napping, used her call light to call out & became emotional. She climbed off the bed & jerked the IV pole's cord from the wall, then walked to bathroom while saying "I'm about to leave." Once she returned to her room she made a phone call, was heard crying & then a few minutes later was found to be sleeping again. Pt placement has called since then to be sure pt does not need precautions, she is still waiting for a room at this time.

## 2019-08-28 NOTE — Progress Notes (Addendum)
Note created in error.

## 2019-08-28 NOTE — Progress Notes (Signed)
Pharmacy Antibiotic Note  Rachel Meadows is a 28 y.o. female admitted on 08/27/2019 with cellulitis.  Pharmacy has been consulted for Vancomycin dosing.  Plan: - No loading dose indicated for cellulitis - Vancomycin 750 mg IV q12h  - Est calc AUC 458 - Monitor patients renal function     Temp (24hrs), Avg:99.4 F (37.4 C), Min:98.1 F (36.7 C), Max:101.7 F (38.7 C)  Recent Labs  Lab 08/27/19 2218  WBC 13.0*  CREATININE 0.52  LATICACIDVEN 1.0    CrCl cannot be calculated (Unknown ideal weight.).    No Known Allergies  Antimicrobials this admission: 12/26 Ancef >>  12/26 Vancomycin >>   Dose adjustments this admission:   Microbiology results: 12/26 BCx: Pending   Thank you for allowing pharmacy to be a part of this patient's care.  Duanne Limerick PharmD. BCPS  08/28/2019 9:59 AM
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
## 2019-08-29 ENCOUNTER — Encounter (HOSPITAL_COMMUNITY): Payer: Self-pay | Admitting: Internal Medicine

## 2019-08-29 DIAGNOSIS — F419 Anxiety disorder, unspecified: Secondary | ICD-10-CM

## 2019-08-29 DIAGNOSIS — L03113 Cellulitis of right upper limb: Secondary | ICD-10-CM

## 2019-08-29 DIAGNOSIS — A419 Sepsis, unspecified organism: Secondary | ICD-10-CM

## 2019-08-29 DIAGNOSIS — F329 Major depressive disorder, single episode, unspecified: Secondary | ICD-10-CM

## 2019-08-29 DIAGNOSIS — F1193 Opioid use, unspecified with withdrawal: Secondary | ICD-10-CM

## 2019-08-29 DIAGNOSIS — K219 Gastro-esophageal reflux disease without esophagitis: Secondary | ICD-10-CM

## 2019-08-29 DIAGNOSIS — D649 Anemia, unspecified: Secondary | ICD-10-CM

## 2019-08-29 LAB — CBC
HCT: 34.6 % — ABNORMAL LOW (ref 36.0–46.0)
Hemoglobin: 10.8 g/dL — ABNORMAL LOW (ref 12.0–15.0)
MCH: 29.6 pg (ref 26.0–34.0)
MCHC: 31.2 g/dL (ref 30.0–36.0)
MCV: 94.8 fL (ref 80.0–100.0)
Platelets: 431 10*3/uL — ABNORMAL HIGH (ref 150–400)
RBC: 3.65 MIL/uL — ABNORMAL LOW (ref 3.87–5.11)
RDW: 12.8 % (ref 11.5–15.5)
WBC: 11.2 10*3/uL — ABNORMAL HIGH (ref 4.0–10.5)
nRBC: 0 % (ref 0.0–0.2)

## 2019-08-29 LAB — BASIC METABOLIC PANEL
Anion gap: 10 (ref 5–15)
BUN: 5 mg/dL — ABNORMAL LOW (ref 6–20)
CO2: 22 mmol/L (ref 22–32)
Calcium: 8.8 mg/dL — ABNORMAL LOW (ref 8.9–10.3)
Chloride: 103 mmol/L (ref 98–111)
Creatinine, Ser: 0.54 mg/dL (ref 0.44–1.00)
GFR calc Af Amer: >60 mL/min (ref >60)
GFR calc non Af Amer: >60 mL/min (ref >60)
Glucose, Bld: 110 mg/dL — ABNORMAL HIGH (ref 70–99)
Potassium: 3.4 mmol/L — ABNORMAL LOW (ref 3.5–5.1)
Sodium: 135 mmol/L (ref 135–145)

## 2019-08-29 LAB — PROTIME-INR
INR: 1.1 (ref 0.8–1.2)
Prothrombin Time: 14.2 seconds (ref 11.4–15.2)

## 2019-08-29 LAB — CORTISOL-AM, BLOOD: Cortisol - AM: 30.1 ug/dL — ABNORMAL HIGH (ref 6.7–22.6)

## 2019-08-29 LAB — HIV ANTIBODY (ROUTINE TESTING W REFLEX): HIV Screen 4th Generation wRfx: NONREACTIVE

## 2019-08-29 LAB — PROCALCITONIN: Procalcitonin: <0.1 ng/mL

## 2019-08-29 MED ORDER — VANCOMYCIN HCL 750 MG/150ML IV SOLN
750.0000 mg | Freq: Two times a day (BID) | INTRAVENOUS | Status: DC
  Filled 2019-08-29: qty 150

## 2019-08-29 MED ORDER — PENICILLIN G POTASSIUM 20000000 UNITS IJ SOLR: 4.0000 10*6.[IU] | Freq: Three times a day (TID) | INTRAVENOUS | Status: DC

## 2019-08-29 MED ORDER — PENICILLIN G POTASSIUM 20000000 UNITS IJ SOLR
4.0000 10*6.[IU] | INTRAVENOUS | Status: DC
  Administered 2019-08-29 – 2019-08-30 (×4): 4 10*6.[IU] via INTRAVENOUS
  Filled 2019-08-29 (×8): qty 4

## 2019-08-29 MED ORDER — HYDROMORPHONE HCL 1 MG/ML IJ SOLN
1.0000 mg | INTRAMUSCULAR | Status: DC | PRN
  Administered 2019-08-29 – 2019-08-30 (×3): 2 mg via INTRAVENOUS
  Administered 2019-08-30: 01:00:00 1 mg via INTRAVENOUS
  Administered 2019-08-30: 15:00:00 2 mg via INTRAVENOUS
  Administered 2019-08-30: 1 mg via INTRAVENOUS
  Administered 2019-08-31 (×2): 2 mg via INTRAVENOUS
  Administered 2019-08-31: 1 mg via INTRAVENOUS
  Administered 2019-08-31 – 2019-09-01 (×6): 2 mg via INTRAVENOUS
  Filled 2019-08-29 (×15): qty 2

## 2019-08-29 MED ORDER — OXYCODONE HCL ER 10 MG PO T12A
10.0000 mg | EXTENDED_RELEASE_TABLET | Freq: Two times a day (BID) | ORAL | Status: DC
  Administered 2019-08-29 – 2019-09-01 (×7): 10 mg via ORAL
  Filled 2019-08-29 (×7): qty 1

## 2019-08-29 NOTE — Progress Notes (Signed)
  Subjective:  Patient seen at bedside this morning.  Patient states that her arm pain is worsening and she is feeling withdrawal symptoms.  Patient expresses interest in being started on Suboxone.  We counseled patient that while we are try to manage her pain we will avoid initiating Suboxone therapy.  We informed patient that we will try to control her pain and withdrawal symptoms with IV pain medications (Dilaudid).   Objective:    Vital Signs (last 24 hours): Vitals:   08/28/19 1837 08/28/19 1900 08/28/19 2032 08/29/19 0359  BP: 127/84 (!) 105/59 106/65 114/73  Pulse: 80  92 89  Resp: 16  16   Temp:   98.2 F (36.8 C) 98.5 F (36.9 C)  TempSrc:   Oral Oral  SpO2: 98%  96% 100%    Physical Exam: General Alert and answers questions appropriately, no acute distress  Cardiac Regular rate and rhythm, no murmurs, rubs, or gallops  Pulmonary Clear to auscultation bilaterally without wheezes, rhonchi, or rales   CBC Latest Ref Rng & Units 08/29/2019 08/27/2019 06/29/2014  WBC 4.0 - 10.5 K/uL 11.2(H) 13.0(H) 13.0(H)  Hemoglobin 12.0 - 15.0 g/dL 10.8(L) 11.9(L) 10.6(L)  Hematocrit 36.0 - 46.0 % 34.6(L) 37.9 31.8(L)  Platelets 150 - 400 K/uL 431(H) 484(H) 315   BMP Latest Ref Rng & Units 08/29/2019 08/27/2019 06/29/2014  Glucose 70 - 99 mg/dL 110(H) 109(H) 107(H)  BUN 6 - 20 mg/dL 5(L) <5(L) 10  Creatinine 0.44 - 1.00 mg/dL 0.54 0.52 0.62  Sodium 135 - 145 mmol/L 135 132(L) 137  Potassium 3.5 - 5.1 mmol/L 3.4(L) 3.9 3.4(L)  Chloride 98 - 111 mmol/L 103 98 101  CO2 22 - 32 mmol/L 22 21(L) 22  Calcium 8.9 - 10.3 mg/dL 8.8(L) 9.4 9.0    Assessment/Plan:   Active Problems:   Cellulitis of right arm   Sepsis [facility])  Patient is a 28 year old female with past medical history significant for IV drug use disorder who presented on 12/26 with a 3-day history of redness, pain to right arm with clinical picture consistent with cellulitis.  # Sepsis: # Cellulitis of right arm: Patient  with 3-day history of swelling, redness in setting of IV drug use.  Patient with elevated white count at 13.0, febrile on presentation to 101.7 Fahrenheit and tachycardic.  Lactate was within normal limits.  Patient was given IV vancomycin + cefazolin.  CT forearm was performed which showed superficial thrombophlebitis but did not identify any any abscesses. * Blood culture x2 drawn - aerobic bottle in one set shows gram positive cocci in chains, other set was insufficient collection. Repeat cultures attempted this morning but patient declined *Continue on IV vancomycin.  Wound is nonpurulent, but MRSA coverage currently indicated due to systemic symptoms as well as IV drug use.  *Tylenol as needed for pain  # Normocytic anemia: Hemoglobin of 10.8, stable, no signs of active bleeding, value at baseline.  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  Diet: Regular DVT Ppx: Lovenox 40mg  daily Dispo: Anticipated discharge pending clinical improvement  Jeanmarie Hubert, MD 08/29/2019, 6:22 AM Pager: [phone]

## 2019-08-29 NOTE — Plan of Care (Signed)
  Problem: Education: Goal: Knowledge of General Education information will improve Description: Including pain rating scale, medication(s)/side effects and non-pharmacologic comfort measures Outcome: Progressing   Problem: Clinical Measurements: Goal: Respiratory complications will improve Outcome: Progressing Note: On room air   Problem: Activity: Goal: Risk for activity intolerance will decrease Outcome: Progressing Note: Up ad lib in room ,tolerating well   Problem: Nutrition: Goal: Adequate nutrition will be maintained Outcome: Progressing   Problem: Coping: Goal: Level of anxiety will decrease Outcome: Progressing   Problem: Elimination: Goal: Will not experience complications related to urinary retention Outcome: Progressing   Problem: Safety: Goal: Ability to remain free from injury will improve Outcome: Progressing   Problem: Clinical Measurements: Goal: Ability to avoid or minimize complications of infection will improve Outcome: Progressing Note: Receiving IV abx   Problem: Pain Managment: Goal: General experience of comfort will improve Outcome: Not Progressing Note: Continues to complain of 10 out of 10 pain in right arm, PRN dilaudid given twice with little relief, oxycontin scheduled pain medication given as well

## 2019-08-29 NOTE — Progress Notes (Signed)
Updated Dr. Benjamine Mola, pt refused blood cultures. No new orders

## 2019-08-30 ENCOUNTER — Inpatient Hospital Stay (HOSPITAL_COMMUNITY): Payer: Self-pay

## 2019-08-30 DIAGNOSIS — B950 Streptococcus, group A, as the cause of diseases classified elsewhere: Secondary | ICD-10-CM

## 2019-08-30 DIAGNOSIS — R7881 Bacteremia: Secondary | ICD-10-CM

## 2019-08-30 LAB — BASIC METABOLIC PANEL
Anion gap: 9 (ref 5–15)
BUN: 6 mg/dL (ref 6–20)
CO2: 26 mmol/L (ref 22–32)
Calcium: 8.9 mg/dL (ref 8.9–10.3)
Chloride: 102 mmol/L (ref 98–111)
Creatinine, Ser: 0.54 mg/dL (ref 0.44–1.00)
GFR calc Af Amer: >60 mL/min (ref >60)
GFR calc non Af Amer: >60 mL/min (ref >60)
Glucose, Bld: 115 mg/dL — ABNORMAL HIGH (ref 70–99)
Potassium: 4 mmol/L (ref 3.5–5.1)
Sodium: 137 mmol/L (ref 135–145)

## 2019-08-30 LAB — CBC
HCT: 33.2 % — ABNORMAL LOW (ref 36.0–46.0)
Hemoglobin: 10.6 g/dL — ABNORMAL LOW (ref 12.0–15.0)
MCH: 29.4 pg (ref 26.0–34.0)
MCHC: 31.9 g/dL (ref 30.0–36.0)
MCV: 92 fL (ref 80.0–100.0)
Platelets: 487 10*3/uL — ABNORMAL HIGH (ref 150–400)
RBC: 3.61 MIL/uL — ABNORMAL LOW (ref 3.87–5.11)
RDW: 12.6 % (ref 11.5–15.5)
WBC: 10.2 10*3/uL (ref 4.0–10.5)
nRBC: 0 % (ref 0.0–0.2)

## 2019-08-30 MED ORDER — PENICILLIN G POTASSIUM 20000000 UNITS IJ SOLR
12.0000 10*6.[IU] | Freq: Two times a day (BID) | INTRAVENOUS | Status: DC
  Administered 2019-08-30 – 2019-09-01 (×5): 12 10*6.[IU] via INTRAVENOUS
  Filled 2019-08-30 (×8): qty 12

## 2019-08-30 NOTE — Progress Notes (Signed)
Patient re-evaluated at bedside to reassess cellulitis extent. It has not tracked past marker line drawn this AM.   Patient is also aware that her husband will not be able to stay overnight. She reports understanding and states she will figure it out.    Dr. Jose Persia Internal Medicine PGY-1  Pager: [phone] 08/30/2019, 3:38 PM

## 2019-08-30 NOTE — Progress Notes (Signed)
  Echocardiogram 2D Echocardiogram has been performed.  Rachel Meadows 08/30/2019, 11:10 AM

## 2019-08-30 NOTE — Plan of Care (Signed)
  Problem: Education: Goal: Knowledge of General Education information will improve Description: Including pain rating scale, medication(s)/side effects and non-pharmacologic comfort measures Outcome: Progressing   Problem: Clinical Measurements: Goal: Respiratory complications will improve Outcome: Progressing   Problem: Activity: Goal: Risk for activity intolerance will decrease Outcome: Progressing Note: Up independently in room, tolerating well   Problem: Nutrition: Goal: Adequate nutrition will be maintained Outcome: Progressing   Problem: Coping: Goal: Level of anxiety will decrease Outcome: Progressing   Problem: Elimination: Goal: Will not experience complications related to bowel motility Outcome: Progressing Goal: Will not experience complications related to urinary retention Outcome: Progressing   Problem: Safety: Goal: Ability to remain free from injury will improve Outcome: Progressing   Problem: Clinical Measurements: Goal: Ability to avoid or minimize complications of infection will improve Outcome: Progressing Note: Receiving IV abx   Problem: Pain Managment: Goal: General experience of comfort will improve Outcome: Not Progressing Note: Still complaining of 10 out of 10 pain to right arm, continues to get dilaudid as needed q4 and scheduled oxycontin q12

## 2019-08-30 NOTE — Progress Notes (Signed)
Subjective:   Rachel Meadows reports she is still having significant right arm pain. She notes she asked her nurse for Dilaudid 2 hours prior but had not received it yet. In addition, she notes intermittent withdrawal symptoms that include tremors, nausea, diaphoresis but denies vomiting and diarrhea. Feels that Dilaudid is not adequate, however she had only received one dose overnight.   Discussed bacteremia and need for 2 weeks of antibiotics. She is concerned that her husband lives in [place] and does not have a car. She would be more inclined to agree to treatment if husband can stay overnight. We did state it is unlikely to be allowed but will ask.   Objective:  Vital signs in last 24 hours: Vitals:   08/29/19 1331 08/29/19 1341 08/29/19 1954 08/30/19 0247  BP: 112/65  108/69 107/63  Pulse: 93  91 89  Resp: 16     Temp: 98 F (36.7 C)  98.4 F (36.9 C)   TempSrc: Oral  Oral   SpO2: 99%  100% 99%  Weight:  68 kg    Height:  5\' 2"  (1.575 m)      Physical Exam Vitals and nursing note reviewed.  Constitutional:      General: She is not in acute distress.    Appearance: She is not toxic-appearing.  Cardiovascular:     Rate and Rhythm: Normal rate and regular rhythm.  No extrasystoles are present.    Heart sounds: No murmur. No gallop.   Pulmonary:     Effort: Pulmonary effort is normal.     Breath sounds: Normal breath sounds. No decreased breath sounds, wheezing or rhonchi.  Musculoskeletal:     Right lower leg: Tenderness (R upper extremity) present.  Skin:    General: Skin is warm and dry.     Findings: Erythema (Increased erythema extending past previous marker line.) present.  Neurological:     General: No focal deficit present.     Mental Status: She is alert and oriented to person, place, and time.  Psychiatric:        Mood and Affect: Mood normal.        Behavior: Behavior normal.    Assessment/Plan:  Active Problems:   Cellulitis of right arm   Sepsis  [facility])  Rachel Meadows is a 28 year old female with PMHx of IVDU who presented on 12/26 with a 3-day history of redness, pain to right arm with clinical picture consistent with cellulitis, course complicated by bacteremia.  # Sepsis: # Cellulitis (Right arm):  # Group A Strep Bacteremia:  Patient with 3-day history of swelling, redness in setting of recent IV drug use. Initial lab work and physical examination consistent with sepsis; lactic acid normal. Patient was started on IV vancomycin + cefazolin initially. CT forearm (12/26) showed cellulitis and superficial thrombophlebitis but did not identify any any abscesses or osteo. Blood cultures (1/4 bottles) grew GAS, discussed with ID pharmacist 05-17-1992. Strongly advised to begin treatment as GAS bacteremia as rarely a contaminant. Antibiotic narrowed to Penicillin, will need at least 14 day course. Repeat cultures drawn and pending. Will continue bacteremia work up including TTE. Unfortunately, her cellulitis seems worsened since yesterday. Border redrawn and plan to reassess this PM. If worsening, will advance antibiotic management.   - Blood culture x2 pending  - Penicillin 4 million units q4h  - TTE ordered   # Opioid Use Disorder:  3 year history of IV heroin use. She is interested in Suboxone treatment once acute cellulitis has  resolved.  Unfortunately, she was in moderate withdrawal yesterday but seems slightly improved today.   - COW scale q4h  - IV Dilaudid 1-2 mg q4h PRN - Oxycodone 10 mg q12h   # Normocytic anemia:  No signs of active bleeding and chronic in nature. Will obtain iron studies once bacteremia is under control as lab values may be skewed.   - CBC daily   Dispo: Anticipated discharge pending clinical improvement.   Dr. Jose Persia Internal Medicine PGY-1  Pager: [phone] 08/30/2019, 6:56 AM

## 2019-08-30 NOTE — Plan of Care (Signed)

## 2019-08-31 DIAGNOSIS — F1190 Opioid use, unspecified, uncomplicated: Secondary | ICD-10-CM

## 2019-08-31 LAB — CBC WITH DIFFERENTIAL/PLATELET
Abs Immature Granulocytes: 0.03 10*3/uL (ref 0.00–0.07)
Basophils Absolute: 0.1 10*3/uL (ref 0.0–0.1)
Basophils Relative: 1 %
Eosinophils Absolute: 0.2 10*3/uL (ref 0.0–0.5)
Eosinophils Relative: 2 %
HCT: 30.9 % — ABNORMAL LOW (ref 36.0–46.0)
Hemoglobin: 10 g/dL — ABNORMAL LOW (ref 12.0–15.0)
Immature Granulocytes: 0 %
Lymphocytes Relative: 34 %
Lymphs Abs: 2.8 10*3/uL (ref 0.7–4.0)
MCH: 29.5 pg (ref 26.0–34.0)
MCHC: 32.4 g/dL (ref 30.0–36.0)
MCV: 91.2 fL (ref 80.0–100.0)
Monocytes Absolute: 0.6 10*3/uL (ref 0.1–1.0)
Monocytes Relative: 7 %
Neutro Abs: 4.7 10*3/uL (ref 1.7–7.7)
Neutrophils Relative %: 56 %
Platelets: 395 10*3/uL (ref 150–400)
RBC: 3.39 MIL/uL — ABNORMAL LOW (ref 3.87–5.11)
RDW: 12.6 % (ref 11.5–15.5)
WBC: 8.4 10*3/uL (ref 4.0–10.5)
nRBC: 0 % (ref 0.0–0.2)

## 2019-08-31 LAB — IRON AND TIBC
Iron: 14 ug/dL — ABNORMAL LOW (ref 28–170)
Saturation Ratios: 4 % — ABNORMAL LOW (ref 10.4–31.8)
TIBC: 377 ug/dL (ref 250–450)
UIBC: 363 ug/dL

## 2019-08-31 LAB — BASIC METABOLIC PANEL
Anion gap: 11 (ref 5–15)
BUN: 9 mg/dL (ref 6–20)
CO2: 25 mmol/L (ref 22–32)
Calcium: 8.7 mg/dL — ABNORMAL LOW (ref 8.9–10.3)
Chloride: 102 mmol/L (ref 98–111)
Creatinine, Ser: 0.75 mg/dL (ref 0.44–1.00)
GFR calc Af Amer: >60 mL/min (ref >60)
GFR calc non Af Amer: >60 mL/min (ref >60)
Glucose, Bld: 103 mg/dL — ABNORMAL HIGH (ref 70–99)
Potassium: 3.8 mmol/L (ref 3.5–5.1)
Sodium: 138 mmol/L (ref 135–145)

## 2019-08-31 LAB — FERRITIN: Ferritin: 42 ng/mL (ref 11–307)

## 2019-08-31 LAB — CULTURE, BLOOD (ROUTINE X 2)

## 2019-08-31 MED ORDER — SODIUM CHLORIDE 0.9 % IV SOLN: INTRAVENOUS | Status: DC

## 2019-08-31 NOTE — Progress Notes (Signed)
   Subjective:   Rachel Meadows reports no new complaints today. She continues to have right arm pain that is unchanged in quality or severity. She feels that her cellulitis may have spread slightly. In terms of withdrawal, she endorses continued chills but feels it is well controlled with Dilaudid.   Discussed possible need for TEE and treatment plan. All questions and concerns addressed.   Objective:  Vital signs in last 24 hours: Vitals:   08/30/19 0758 08/30/19 1437 08/30/19 2035 08/31/19 0420  BP: 110/63 (!) 100/54 113/71 105/70  Pulse: 91 75 76 78  Resp: 17 17 14 15   Temp: 98.8 F (37.1 C) 98.7 F (37.1 C) 98.2 F (36.8 C) 98.6 F (37 C)  TempSrc: Oral Oral Oral Oral  SpO2: 95% 100% 99% 99%  Weight:      Height:        Physical Exam Vitals and nursing note reviewed.  Constitutional:      Appearance: She is normal weight.  Cardiovascular:     Rate and Rhythm: Normal rate and regular rhythm.  No extrasystoles are present.    Heart sounds: No murmur.  Pulmonary:     Effort: Pulmonary effort is normal.     Breath sounds: Normal breath sounds. No decreased breath sounds, wheezing, rhonchi or rales.  Skin:    General: Skin is warm and dry.     Comments: Erythema maintained within marker edges with the exception of one area on posterior forearm extending minimally beyond marker. Erythema seems less pronounced today.   Neurological:     General: No focal deficit present.     Mental Status: She is alert. She is disoriented.  Psychiatric:        Mood and Affect: Mood normal.        Behavior: Behavior normal.    Assessment/Plan:  Active Problems:   Cellulitis of right arm   Sepsis [facility])  Ms. Rachel Meadows is a 28 year old female with PMHx of IVDU whopresented on 12/26 with a 3-day history of redness, pain to right arm with clinical picture consistent with cellulitis, course complicated by bacteremia.  # Sepsis: # Cellulitis (Right arm): # Group A Strep Bacteremia:  Patient  with 3-day history of swelling, redness in setting of recent IV drug use. Initial lab work and physical examination consistent with sepsis; lactic acid normal. Patient was started on IV vancomycin + cefazolin initially. CT forearm (12/26) showed cellulitis and superficial thrombophlebitis but did not identify any any abscesses or osteo. Blood cultures (1/4 bottles) grew GAS, discussed with ID pharmacist Ysidro Evert. Strongly advised to begin treatment as GAS bacteremia as rarely a contaminant. Antibiotic narrowed to Penicillin. Repeat cultures NGTD. TTE negative. Discussed case with ID who strongly recommend TEE given high risk. If negative, may consider transition to PO Linezolid.   - Penicillin 4 million units q4h  - TEE   # Opioid Use Disorder:  3 year history of IV heroin use. She is interested in Suboxone treatment once acute cellulitis has resolved. COWS ranging from 0-1. Only has used 4 doses of Dilaudid per day usually.   - COW scale q4h  - IV Dilaudid 1-2 mg q4h PRN - Oxycodone 10 mg q12h   # Normocytic anemia: No signs of active bleeding and chronic in nature. Iron studies pending   - CBC daily - Iron studies pending    Dispo: Anticipated discharge pending clinical improvement.   Dr. Jose Persia Internal Medicine PGY-1  Pager: [phone] 08/31/2019, 6:37 AM

## 2019-08-31 NOTE — Plan of Care (Signed)
  Problem: Education: Goal: Knowledge of General Education information will improve Description: Including pain rating scale, medication(s)/side effects and non-pharmacologic comfort measures Outcome: Progressing   Problem: Activity: Goal: Risk for activity intolerance will decrease Outcome: Progressing   Problem: Pain Managment: Goal: General experience of comfort will improve Outcome: Progressing   Problem: Safety: Goal: Ability to remain free from injury will improve Outcome: Progressing   Problem: Skin Integrity: Goal: Risk for impaired skin integrity will decrease Outcome: Progressing   Problem: Skin Integrity: Goal: Skin integrity will improve Outcome: Progressing

## 2019-08-31 NOTE — Progress Notes (Signed)
    [facility] has been requested to perform a transesophageal echocardiogram on 09/01/2019 for bacteremia.  After careful review of history and examination, the risks and benefits of transesophageal echocardiogram have been explained including risks of esophageal damage, perforation (1:10,000 risk), bleeding, pharyngeal hematoma as well as other potential complications associated with conscious sedation including aspiration, arrhythmia, respiratory failure and death. Alternatives to treatment were discussed, questions were answered. Patient is willing to proceed.   TEE scheduled for 09/01/2019 at 1pm with Dr. Debara Pickett.  Roby Lofts, PA-C 08/31/2019 12:23 PM

## 2019-08-31 NOTE — Plan of Care (Signed)

## 2019-09-01 ENCOUNTER — Encounter (HOSPITAL_COMMUNITY)
Admission: EM | Discharge status: Against Medical Advice | Payer: Self-pay | Source: Home / Self Care | Attending: Internal Medicine

## 2019-09-01 ENCOUNTER — Inpatient Hospital Stay (HOSPITAL_COMMUNITY): Payer: Self-pay | Admitting: Certified Registered Nurse Anesthetist

## 2019-09-01 ENCOUNTER — Encounter (HOSPITAL_COMMUNITY): Payer: Self-pay | Admitting: Internal Medicine

## 2019-09-01 ENCOUNTER — Inpatient Hospital Stay (HOSPITAL_COMMUNITY): Payer: Self-pay

## 2019-09-01 DIAGNOSIS — R7881 Bacteremia: Secondary | ICD-10-CM

## 2019-09-01 HISTORY — PX: TEE WITHOUT CARDIOVERSION: SHX5443

## 2019-09-01 LAB — CBC WITH DIFFERENTIAL/PLATELET
Abs Immature Granulocytes: 0.03 10*3/uL (ref 0.00–0.07)
Basophils Absolute: 0.1 10*3/uL (ref 0.0–0.1)
Basophils Relative: 1 %
Eosinophils Absolute: 0.2 10*3/uL (ref 0.0–0.5)
Eosinophils Relative: 2 %
HCT: 31.5 % — ABNORMAL LOW (ref 36.0–46.0)
Hemoglobin: 10.1 g/dL — ABNORMAL LOW (ref 12.0–15.0)
Immature Granulocytes: 0 %
Lymphocytes Relative: 38 %
Lymphs Abs: 3 10*3/uL (ref 0.7–4.0)
MCH: 29.1 pg (ref 26.0–34.0)
MCHC: 32.1 g/dL (ref 30.0–36.0)
MCV: 90.8 fL (ref 80.0–100.0)
Monocytes Absolute: 0.6 10*3/uL (ref 0.1–1.0)
Monocytes Relative: 7 %
Neutro Abs: 4.1 10*3/uL (ref 1.7–7.7)
Neutrophils Relative %: 52 %
Platelets: 479 10*3/uL — ABNORMAL HIGH (ref 150–400)
RBC: 3.47 MIL/uL — ABNORMAL LOW (ref 3.87–5.11)
RDW: 12.5 % (ref 11.5–15.5)
WBC: 7.9 10*3/uL (ref 4.0–10.5)
nRBC: 0 % (ref 0.0–0.2)

## 2019-09-01 LAB — BASIC METABOLIC PANEL
Anion gap: 13 (ref 5–15)
BUN: 11 mg/dL (ref 6–20)
CO2: 24 mmol/L (ref 22–32)
Calcium: 8.9 mg/dL (ref 8.9–10.3)
Chloride: 99 mmol/L (ref 98–111)
Creatinine, Ser: 0.54 mg/dL (ref 0.44–1.00)
GFR calc Af Amer: >60 mL/min (ref >60)
GFR calc non Af Amer: >60 mL/min (ref >60)
Glucose, Bld: 91 mg/dL (ref 70–99)
Potassium: 4.1 mmol/L (ref 3.5–5.1)
Sodium: 136 mmol/L (ref 135–145)

## 2019-09-01 LAB — MRSA PCR SCREENING: MRSA by PCR: POSITIVE — AB

## 2019-09-01 SURGERY — ECHOCARDIOGRAM, TRANSESOPHAGEAL
Anesthesia: Monitor Anesthesia Care

## 2019-09-01 MED ORDER — CHLORHEXIDINE GLUCONATE CLOTH 2 % EX PADS
6.0000 | MEDICATED_PAD | Freq: Every day | CUTANEOUS | Status: DC
  Administered 2019-09-01: 6 via TOPICAL

## 2019-09-01 MED ORDER — PROPOFOL 500 MG/50ML IV EMUL
INTRAVENOUS | Status: DC | PRN
  Administered 2019-09-01: 125 ug/kg/min via INTRAVENOUS

## 2019-09-01 MED ORDER — MUPIROCIN 2 % EX OINT
1.0000 "application " | TOPICAL_OINTMENT | Freq: Two times a day (BID) | CUTANEOUS | Status: DC
  Administered 2019-09-01: 1 via NASAL

## 2019-09-01 MED ORDER — LACTATED RINGERS IV SOLN: INTRAVENOUS | Status: DC | PRN

## 2019-09-01 MED ORDER — PROPOFOL 10 MG/ML IV BOLUS
INTRAVENOUS | Status: DC | PRN
  Administered 2019-09-01 (×4): 30 mg via INTRAVENOUS

## 2019-09-01 NOTE — Anesthesia Procedure Notes (Signed)
Procedure Name: MAC Date/Time: 09/01/2019 1:45 PM Performed by: Janene Harvey, CRNA Pre-anesthesia Checklist: Patient identified, Emergency Drugs available, Suction available and Patient being monitored Oxygen Delivery Method: Nasal cannula Dental Injury: Teeth and Oropharynx as per pre-operative assessment

## 2019-09-01 NOTE — Plan of Care (Signed)
  Problem: Education: Goal: Knowledge of General Education information will improve Description: Including pain rating scale, medication(s)/side effects and non-pharmacologic comfort measures Outcome: Progressing   Problem: Health Behavior/Discharge Planning: Goal: Ability to manage health-related needs will improve Outcome: Progressing   Problem: Clinical Measurements: Goal: Ability to maintain clinical measurements within normal limits will improve Outcome: Progressing   Problem: Activity: Goal: Risk for activity intolerance will decrease Outcome: Progressing   Problem: Nutrition: Goal: Adequate nutrition will be maintained Outcome: Progressing   Problem: Coping: Goal: Level of anxiety will decrease Outcome: Progressing   Problem: Elimination: Goal: Will not experience complications related to bowel motility Outcome: Progressing Goal: Will not experience complications related to urinary retention Outcome: Progressing   Problem: Pain Managment: Goal: General experience of comfort will improve Outcome: Progressing   

## 2019-09-01 NOTE — Anesthesia Postprocedure Evaluation (Signed)
Anesthesia Post Note  Patient: Rachel Meadows  Procedure(s) Performed: TRANSESOPHAGEAL ECHOCARDIOGRAM (TEE) (N/A )     Patient location during evaluation: Endoscopy Anesthesia Type: MAC Level of consciousness: awake and alert, patient cooperative and oriented Pain management: pain level controlled Vital Signs Assessment: post-procedure vital signs reviewed and stable Respiratory status: nonlabored ventilation, respiratory function stable and spontaneous breathing Cardiovascular status: blood pressure returned to baseline and stable Postop Assessment: no apparent nausea or vomiting Anesthetic complications: no    Last Vitals:  Vitals:   09/01/19 1407 09/01/19 1420  BP: 122/67 (!) 105/57  Pulse: 75   Resp: 10   Temp: 36.7 C   SpO2: 100%     Last Pain:  Vitals:   09/01/19 1447  TempSrc:   PainSc: 10-Worst pain ever                 Anae Hams,E. Heston Widener

## 2019-09-01 NOTE — Progress Notes (Signed)
This evening pt called to front desk 2 times within a 30 min window asking for pain medications, medications were not due at that time. RN was notified but was tending to another pt. When RN when into pt room about 30 min later, pt was no where to be found, and their belongings were gone. RN Environmental health practitioner and [place] and security were both notified, a missing person alert was sent though-out the hospital by security. Pt left AMA without signing paperwork or having their IV removed. The physician has been notified, awaiting return page, will page again in 15 minutes. [place], RN 09/01/2019 6:54 PM

## 2019-09-01 NOTE — Progress Notes (Signed)
Lab called RN to report positive PCR screen with MRSA in nares. Lab reported this as a critical value; RN placed positive standing orders and notified attending. Will continue to monitor. Garon Melander Elon Spanner, RN 09/01/2019 11:06 AM

## 2019-09-01 NOTE — Anesthesia Preprocedure Evaluation (Addendum)
Anesthesia Evaluation  Patient identified by MRN, date of birth, ID band Patient awake    Reviewed: Allergy & Precautions, NPO status , Patient's Chart, lab work & pertinent test results  History of Anesthesia Complications Negative for: history of anesthetic complications  Airway Mallampati: II  TM Distance: >3 FB Neck ROM: Full    Dental  (+) Poor Dentition, Chipped, Missing, Dental Advisory Given   Pulmonary Current Smoker and Patient abstained from smoking.,  08/28/2019 SARS coronavirus NEG   breath sounds clear to auscultation       Cardiovascular negative cardio ROS   Rhythm:Regular Rate:Normal  08/30/2019 ECHO: EF 65-70%, valves OK   Neuro/Psych PSYCHIATRIC DISORDERS (substance abuse) Anxiety Depression negative neurological ROS     GI/Hepatic GERD  Controlled,(+)     substance abuse  IV drug use,   Endo/Other  negative endocrine ROS  Renal/GU negative Renal ROS     Musculoskeletal  (+) narcotic dependent  Abdominal   Peds  Hematology  (+) Blood dyscrasia (Hb 10.1), anemia ,   Anesthesia Other Findings   Reproductive/Obstetrics                            Anesthesia Physical Anesthesia Plan  ASA: III  Anesthesia Plan: MAC   Post-op Pain Management:    Induction:   PONV Risk Score and Plan: 1 and Treatment may vary due to age or medical condition  Airway Management Planned: Natural Airway and Nasal Cannula  Additional Equipment: None  Intra-op Plan:   Post-operative Plan:   Informed Consent: I have reviewed the patients History and Physical, chart, labs and discussed the procedure including the risks, benefits and alternatives for the proposed anesthesia with the patient or authorized representative who has indicated his/her understanding and acceptance.     Dental advisory given  Plan Discussed with: CRNA and Surgeon  Anesthesia Plan Comments:         Anesthesia Quick Evaluation

## 2019-09-01 NOTE — Transfer of Care (Signed)
Immediate Anesthesia Transfer of Care Note  Patient: Rachel Meadows  Procedure(s) Performed: TRANSESOPHAGEAL ECHOCARDIOGRAM (TEE) (N/A )  Patient Location: Endoscopy Unit  Anesthesia Type:MAC  Level of Consciousness: awake  Airway & Oxygen Therapy: Patient Spontanous Breathing  Post-op Assessment: Report given to RN and Post -op Vital signs reviewed and stable  Post vital signs: Reviewed  Last Vitals:  Vitals Value Taken Time  BP    Temp    Pulse    Resp    SpO2      Last Pain:  Vitals:   09/01/19 1254  TempSrc: Temporal  PainSc: 0-No pain      Patients Stated Pain Goal: 2 (79/89/21 1941)  Complications: No apparent anesthesia complications

## 2019-09-01 NOTE — Progress Notes (Signed)
  Echocardiogram Echocardiogram Transesophageal has been performed.  Rachel Meadows 09/01/2019, 2:12 PM

## 2019-09-01 NOTE — Progress Notes (Signed)
   Subjective:   Rachel Meadows reports she is doing well today. She feels her arm is improving in terms of redness and pain level. No other acute complaints today.   Objective:  Vital signs in last 24 hours: Vitals:   08/31/19 1539 08/31/19 1925 09/01/19 0401 09/01/19 0731  BP: 111/75 102/79 (!) 101/54 109/70  Pulse: 83 84 77 65  Resp:  16 17 16   Temp: 98.3 F (36.8 C) 97.9 F (36.6 C) 98.7 F (37.1 C) 98.2 F (36.8 C)  TempSrc: Oral Oral Oral Oral  SpO2: 97% 100% 98% 99%  Weight:      Height:        Physical Exam Vitals and nursing note reviewed.  Constitutional:      Appearance: She is well-developed.  Cardiovascular:     Rate and Rhythm: Normal rate and regular rhythm.     Heart sounds: No murmur. No gallop.   Pulmonary:     Effort: Pulmonary effort is normal.     Breath sounds: Normal breath sounds.  Skin:    Findings: Erythema (improving significantly since yesterday) present.  Neurological:     General: No focal deficit present.     Mental Status: She is alert and oriented to person, place, and time.  Psychiatric:        Mood and Affect: Mood normal.        Behavior: Behavior normal.    Assessment/Plan:  Active Problems:   Cellulitis of right arm   Sepsis [facility])  Ms. Modupe Shampine a 28 year old female withPMHx of IVDUwhopresented on 12/26 with a 3-day history of redness, pain to right arm with clinical picture consistent with cellulitis, course complicated by bacteremia.  # Sepsis: # Cellulitis(Right arm): # Group A Strep Bacteremia: CT forearm(12/26)showedcellulitis andsuperficial thrombophlebitis but did not identify any any abscessesor osteo.Blood cultures (1/4bottles) grew GAS. Antibiotic narrowed to Penicillin. Repeat cultures NGTD. Discussed case with ID who mentioned patient may be able to be transitioned to oral Linezolid for an earlier discharge if TEE negative.   - Penicillin 4 million units q4h - TEE today  # Opioid Use Disorder:   3 year history of IV heroin use. She is interested in Suboxone treatment once acute cellulitis has resolved. COWS ranging from 0-1. Since discharge may be sooner than later, may need to initiate suboxone treatment sooner as well. She is receiving Dilaudid q4h and Oxycodone 10 q12h at the moment, and will need to be without it for as close to 12 hours as possible to induce full withdrawal. Plan to discuss this with patient tomorrow AM.   - COW scale q4h  - IV Dilaudid1-2 mg q4h PRN - Oxycodone 10 mg q12h  # Normocytic anemia: No signs of active bleedingand chronic in nature. Iron studies iron deficiency anemia. Will hold off on starting iron until acute infection is resolved.   - CBC daily   Dispo: Anticipated dischargepending clinical improvement.  Dr. Jose Persia Internal Medicine PGY-1  Pager: [phone] 09/01/2019, 7:53 AM

## 2019-09-01 NOTE — CV Procedure (Signed)
   TRANSESOPHAGEAL ECHOCARDIOGRAM (TEE) NOTE  INDICATIONS: infective endocarditis  PROCEDURE:   Informed consent was obtained prior to the procedure. The risks, benefits and alternatives for the procedure were discussed and the patient comprehended these risks.  Risks include, but are not limited to, cough, sore throat, vomiting, nausea, somnolence, esophageal and stomach trauma or perforation, bleeding, low blood pressure, aspiration, pneumonia, infection, trauma to the teeth and death.    After a procedural time-out, the patient was given propofol per anesthesia for sedation.  The patient's heart rate, blood pressure, and oxygen saturation are monitored continuously during the procedure. The transesophageal probe was inserted in the esophagus and stomach without difficulty and multiple views were obtained.  The patient was kept under observation until the patient left the procedure room. The patient left the procedure room in stable condition.   Agitated microbubble saline contrast was not administered.  COMPLICATIONS:    There were no immediate complications.  Findings:  1. LEFT VENTRICLE: The left ventricular wall thickness is normal.  The left ventricular cavity is normal in size. Wall motion is normal.  LVEF is 60-65%.  2. RIGHT VENTRICLE:  The right ventricle is normal in structure and function without any thrombus or masses.    3. LEFT ATRIUM:  The left atrium is normal in size without any thrombus or masses.  There is not spontaneous echo contrast ("smoke") in the left atrium consistent with a low flow state.  4. LEFT ATRIAL APPENDAGE:  The left atrial appendage is free of any thrombus or masses. The appendage has single lobes. Pulse doppler indicates moderate flow in the appendage.  5. ATRIAL SEPTUM:  The atrial septum appears intact and is free of thrombus and/or masses.  There is no evidence for interatrial shunting by color doppler and saline microbubble.  6. RIGHT ATRIUM:   The right atrium is normal in size and function without any thrombus or masses.  7. MITRAL VALVE:  The mitral valve is normal in structure and function with no regurgitation.  There were no vegetations or stenosis.  8. AORTIC VALVE:  The aortic valve is trileaflet, normal in structure and function with no regurgitation.  There were no vegetations or stenosis  9. TRICUSPID VALVE:  The tricuspid valve is normal in structure and function with no regurgitation.  There were no vegetations or stenosis  10.  PULMONIC VALVE:  The pulmonic valve is normal in structure and function with no regurgitation.  There were no vegetations or stenosis.   11. AORTIC ARCH, ASCENDING AND DESCENDING AORTA:  There was no Ron Parker et. Al, 1992) atherosclerosis of the ascending aorta, aortic arch, or proximal descending aorta.  12. PULMONARY VEINS: Anomalous pulmonary venous return was not noted.  13. PERICARDIUM: The pericardium appeared normal and non-thickened.  There is no pericardial effusion.  IMPRESSION:   1. No endocarditis 2. Normal echo 3. LVEF 60-65%  RECOMMENDATIONS:    1.  Treatment of cellulitis/bacteremia per ID recs.  Time Spent Directly with the Patient:  45 minutes   Pixie Casino, MD, [facility], [place] Director of the Advanced Lipid Disorders &  Cardiovascular Risk Reduction Clinic Diplomate of the American Board of Clinical Lipidology Attending Cardiologist  Direct Dial: [phone]  Fax: [phone]  Website:  [email or web address] 09/01/2019, 2:04 PM

## 2019-09-01 NOTE — H&P (Signed)
   INTERVAL PROCEDURE H&P  History and Physical Interval Note:  09/01/2019 12:42 PM  Rachel Meadows has presented today for their planned procedure. The various methods of treatment have been discussed with the patient and family. After consideration of risks, benefits and other options for treatment, the patient has consented to the procedure.  The patients' outpatient history has been reviewed, patient examined, and no change in status from most recent office note within the past 30 days. I have reviewed the patients' chart and labs and will proceed as planned. Questions were answered to the patient's satisfaction.   Pixie Casino, MD, [facility], [place] Director of the Advanced Lipid Disorders &  Cardiovascular Risk Reduction Clinic Diplomate of the American Board of Clinical Lipidology Attending Cardiologist  Direct Dial: [phone]  Fax: [phone]  Website:  [email or web address] 09/01/2019, 12:42 PM

## 2019-09-02 LAB — CULTURE, BLOOD (ROUTINE X 2): Culture: NO GROWTH

## 2019-09-03 LAB — CULTURE, BLOOD (ROUTINE X 2)
Culture: NO GROWTH
Culture: NO GROWTH
Special Requests: ADEQUATE
Special Requests: ADEQUATE

## 2019-09-08 NOTE — Discharge Summary (Signed)
Name: Rachel Meadows MRN: [id number] DOB: [birth year] 29 y.o. PCP: Patient, No Pcp Per  Date of Admission: 08/27/2019 10:04 PM Date of Discharge: 09/01/2019 Attending Physician: Dr. Criselda Peaches  Discharge Diagnosis: 1. Cellulitis  2. Group A Strep Bacteremia  3. Opioid Use Disorder   Discharge Medications: Allergies as of 09/01/2019   No Known Allergies     Medication List    ASK your doctor about these medications   ALPRAZolam 0.5 MG tablet Commonly known as: XANAX Take 1 tablet (0.5 mg total) by mouth 3 (three) times daily as needed. for anxiety   cyclobenzaprine 10 MG tablet Commonly known as: FLEXERIL Take 1 tablet (10 mg total) by mouth 2 (two) times daily as needed for muscle spasms.   HYDROcodone-acetaminophen 5-325 MG tablet Commonly known as: NORCO/VICODIN Take 1 tablet by mouth every 6 (six) hours as needed for moderate pain.   ibuprofen 800 MG tablet Commonly known as: ADVIL Take 1 tablet (800 mg total) by mouth every 8 (eight) hours as needed.   zolpidem 10 MG tablet Commonly known as: AMBIEN Take 1 tablet (10 mg total) by mouth at bedtime as needed for sleep.       Disposition and follow-up:   Rachel Meadows left [facility] AMA on 09/01/2019. Unfortunately, patient had left hospital before RN or MD could discuss risks of leaving. IV was not removed prior to departure either.   Hospital Course by problem list: 1. Cellulitis  Rachel Meadows presented with 3-day history of swelling, redness in setting of recent IV drug use. CT forearm (results below) was negative for abscess or osteomyelitis. Treatment initially with Vancomycin and Cefazolin but narrowed to Penicillin. On last day patient was evaluated, cellulitis was beginning to improve significantly.   2. Group A Strep Bacteremia  Blood cultures drew on 08/27/2019 (1/4bottles) grew Group A Strep. Discussed with ID pharmacist, who recommended initiating bacteremia treatment, as GAS is a very rare contaminant.  Antibiotics were narrowed to Penicillin. TTE and TTE negative for endocarditis. Repeat blood cultures negative.   3. Opioid Use Disorder 3 year history of IV heroin use. She is interested in Suboxone treatment however unable to initiate prior to [facility].   Discharge Vitals:   BP (!) 105/57   Pulse 75   Temp 98.1 F (36.7 C) (Temporal)   Resp 10   Ht 5\' 2"  (1.575 m)   Wt 68 kg   SpO2 100%   BMI 27.44 kg/m   Pertinent Labs, Studies, and Procedures:   CBC Latest Ref Rng & Units 09/01/2019 08/31/2019 08/30/2019  WBC 4.0 - 10.5 K/uL 7.9 8.4 10.2  Hemoglobin 12.0 - 15.0 g/dL 10.1(L) 10.0(L) 10.6(L)  Hematocrit 36.0 - 46.0 % 31.5(L) 30.9(L) 33.2(L)  Platelets 150 - 400 K/uL 479(H) 395 487(H)   BMP Latest Ref Rng & Units 09/01/2019 08/31/2019 08/30/2019  Glucose 70 - 99 mg/dL 91 09/01/2019) 846(N)  BUN 6 - 20 mg/dL 11 9 6   Creatinine 0.44 - 1.00 mg/dL 629(B 2.84  Sodium 135 - 145 mmol/L 136 138 137  Potassium 3.5 - 5.1 mmol/L 4.1 3.8 4.0  Chloride 98 - 111 mmol/L 99 102 102  CO2 22 - 32 mmol/L 24 25 26   Calcium 8.9 - 10.3 mg/dL 8.9 1.32) 8.9   CXR: 4.40 FINDINGS: The heart size and mediastinal contours are within normal limits. Both lungs are clear. The visualized skeletal structures are unremarkable.  CT Right Forearm: 08/28/2019 IMPRESSION: 1. Findings are consistent with cellulitis and superficial thrombophlebitis of  the cephalic vein branches in the distal upper arm and proximal forearm. 2. No evidence of osteomyelitis or septic joint. 3. No evidence of soft tissue abscess or soft tissue emphysema.  TTE: 08/30/2019 IMPRESSIONS  1. Left ventricular ejection fraction, by visual estimation, is 65 to 70%. The left ventricle has normal function. There is no left ventricular hypertrophy.  2. The left ventricle has no regional wall motion abnormalities.  3. Global right ventricle has normal systolic function.The right ventricular size is normal. No increase in right  ventricular wall thickness.  4. Left atrial size was normal.  5. Right atrial size was normal.  6. The mitral valve is grossly normal. No evidence of mitral valve regurgitation.  7. The tricuspid valve is normal in structure.  8. The aortic valve is tricuspid. Aortic valve regurgitation is not visualized.  9. The pulmonic valve was grossly normal. Pulmonic valve regurgitation is not visualized. 10. The inferior vena cava is normal in size with greater than 50% respiratory variability, suggesting right atrial pressure of 3 mmHg. 11. Normal Echocardiogram. 12. No evidence of any gross valvular vegetations.  TEE: 09/01/2019 IMPRESSIONS  1. Left ventricular ejection fraction, by visual estimation, is 60 to 65%. The left ventricle has normal function. There is no left ventricular hypertrophy.  2. The left ventricle has no regional wall motion abnormalities.  3. Global right ventricle has normal systolic function.The right ventricular size is normal. No increase in right ventricular wall thickness.  4. Left atrial size was normal.  5. Right atrial size was normal.  6. The mitral valve is normal in structure. No evidence of mitral valve regurgitation. No evidence of mitral stenosis.  7. The tricuspid valve is normal in structure.  8. The aortic valve is normal in structure. Aortic valve regurgitation is not visualized. No evidence of aortic valve sclerosis or stenosis.  9. The pulmonic valve was normal in structure. Pulmonic valve regurgitation is not visualized. 10. The inferior vena cava is normal in size with greater than 50% respiratory variability, suggesting right atrial pressure of 3 mmHg. 11. No evidence of any gross valvular vegetations.    Signed: Dr. Jose Persia Internal Medicine PGY-1  Pager: [phone] 09/08/2019, 7:14 AM

## 2021-01-24 ENCOUNTER — Encounter (HOSPITAL_COMMUNITY): Payer: Self-pay

## 2021-01-24 ENCOUNTER — Other Ambulatory Visit (HOSPITAL_COMMUNITY): Payer: Self-pay

## 2021-01-24 ENCOUNTER — Other Ambulatory Visit: Payer: Self-pay

## 2021-01-24 ENCOUNTER — Emergency Department (HOSPITAL_COMMUNITY)
Admission: EM | Admit: 2021-01-24 | Discharge: 2021-01-24 | Disposition: A | Discharge status: Home / Self Care | Payer: Self-pay | Attending: Physician Assistant | Admitting: Physician Assistant

## 2021-01-24 DIAGNOSIS — K0889 Other specified disorders of teeth and supporting structures: Secondary | ICD-10-CM | POA: Insufficient documentation

## 2021-01-24 DIAGNOSIS — F17210 Nicotine dependence, cigarettes, uncomplicated: Secondary | ICD-10-CM | POA: Insufficient documentation

## 2021-01-24 MED ORDER — PENICILLIN V POTASSIUM 500 MG PO TABS
500.0000 mg | ORAL_TABLET | Freq: Four times a day (QID) | ORAL | 0 refills | Status: AC
  Filled 2021-01-24: qty 28, 7d supply, fill #0

## 2021-01-24 MED ORDER — PENICILLIN V POTASSIUM 500 MG PO TABS: 500.0000 mg | ORAL_TABLET | Freq: Four times a day (QID) | ORAL | 0 refills | Status: DC

## 2021-01-24 NOTE — ED Triage Notes (Signed)
Patient complains of right upper dental pain related to broken tooth x 1 month and pain started am.

## 2021-01-24 NOTE — Discharge Instructions (Signed)
You were given a prescription for antibiotics. Please take the antibiotic prescription fully.   Please follow-up with a dentist in the next 5 to 7 days for reevaluation.  If you do not have a dentist, resources were provided for dentist in the area in your discharge summary.  Please contact one of the offices that are listed and make an appointment for follow-up.  Please return to the emergency department for any new or worsening symptoms.  

## 2021-01-24 NOTE — ED Provider Notes (Signed)
[facility] EMERGENCY DEPARTMENT Provider Note   CSN: [id number] Arrival date & time: 01/24/21  1319     History Chief Complaint  Patient presents with  . Dental Problem    Rachel Meadows is a 30 y.o. female.  HPI   30 y/o f presenting for eval of dental pain. Pt had fractured her tooth 1 month ago and started having pain yesterday. Pain constant and severe in nature. Denies fevers or facial swelling. Is working on getting a Education officer, community  Past Medical History:  Diagnosis Date  . Anxiety   . Depression    panic attacks  . GERD (gastroesophageal reflux disease)   . Hearing loss   . Kidney stones   . Pregnant     Patient Active Problem List   Diagnosis Date Noted  . Cellulitis of right arm 08/28/2019  . Sepsis (HCC) 08/28/2019  . Chlamydia infection affecting pregnancy in second trimester, antepartum 04/30/2014  . Preterm premature rupture of membranes in third trimester 04/29/2014  . Marijuana use 01/04/2014  . Supervision of other normal pregnancy 01/03/2014  . Smoker 01/03/2014    Past Surgical History:  Procedure Laterality Date  . anoidectomy     adenoidectomy  . CESAREAN SECTION N/A 05/21/2014   Procedure: CESAREAN SECTION;  Surgeon: Adam Phenix, MD;  Location: [facility];  Service: Obstetrics;  Laterality: N/A;  . CHOLECYSTECTOMY    . EXTERNAL EAR SURGERY Bilateral   . TEE WITHOUT CARDIOVERSION N/A 09/01/2019   Procedure: TRANSESOPHAGEAL ECHOCARDIOGRAM (TEE);  Surgeon: Chrystie Nose, MD;  Location: [facility] ENDOSCOPY;  Service: Cardiovascular;  Laterality: N/A;  . TONSILLECTOMY       OB History    Gravida  3   Para  3   Term  2   Preterm  1   AB      Living  3     SAB      IAB      Ectopic      Multiple      Live Births  3           Family History  Problem Relation Age of Onset  . Kidney failure Brother     Social History   Tobacco Use  . Smoking status: Current Every Day Smoker    Packs/day: 0.50    Years: 7.00     Pack years: 3.50    Types: Cigarettes  . Smokeless tobacco: Never Used  Vaping Use  . Vaping Use: Never used  Substance Use Topics  . Alcohol use: No  . Drug use: Yes    Types: Heroin    Home Medications Prior to Admission medications   Medication Sig Start Date End Date Taking? Authorizing Provider  ALPRAZolam Prudy Feeler) 0.5 MG tablet Take 1 tablet (0.5 mg total) by mouth 3 (three) times daily as needed. for anxiety Patient not taking: Reported on 08/28/2019 03/19/16   Lazaro Arms, MD  cyclobenzaprine (FLEXERIL) 10 MG tablet Take 1 tablet (10 mg total) by mouth 2 (two) times daily as needed for muscle spasms. Patient not taking: Reported on 08/28/2019 12/11/14   Janne Napoleon, NP  HYDROcodone-acetaminophen (NORCO/VICODIN) 5-325 MG per tablet Take 1 tablet by mouth every 6 (six) hours as needed for moderate pain. Patient not taking: Reported on 08/28/2019 04/01/15   Devoria Albe, MD  ibuprofen (ADVIL,MOTRIN) 800 MG tablet Take 1 tablet (800 mg total) by mouth every 8 (eight) hours as needed. Patient not taking: Reported on 08/28/2019 09/20/14  Vickki Hearing, MD  penicillin v potassium (VEETID) 500 MG tablet Take 1 tablet (500 mg total) by mouth 4 (four) times daily for 7 days. 01/24/21 01/31/21  Avery Eustice S, PA-C  zolpidem (AMBIEN) 10 MG tablet Take 1 tablet (10 mg total) by mouth at bedtime as needed for sleep. Patient not taking: Reported on 08/28/2019 11/15/14 08/28/19  Lazaro Arms, MD    Allergies    Patient has no known allergies.  Review of Systems   Review of Systems  Constitutional: Negative for fever.  HENT: Positive for dental problem. Negative for facial swelling.     Physical Exam Updated Vital Signs BP 137/87 (BP Location: Right Arm)   Pulse 95   Temp 98.2 F (36.8 C)   Resp 18   SpO2 100%   Physical Exam Vitals and nursing note reviewed.  Constitutional:      General: She is not in acute distress.    Appearance: She is well-developed.  HENT:      Head: Normocephalic and atraumatic.     Mouth/Throat:     Comments: ttp to the right incisor with fracture present. No swelling to the gums or to the submandibular or submental space. No trismus. Eyes:     Conjunctiva/sclera: Conjunctivae normal.  Cardiovascular:     Rate and Rhythm: Normal rate.  Pulmonary:     Effort: Pulmonary effort is normal.  Musculoskeletal:        General: Normal range of motion.     Cervical back: Neck supple.  Skin:    General: Skin is warm and dry.  Neurological:     Mental Status: She is alert.     ED Results / Procedures / Treatments   Labs (all labs ordered are listed, but only abnormal results are displayed) Labs Reviewed - No data to display  EKG None  Radiology No results found.  Procedures Procedures   Medications Ordered in ED Medications - No data to display  ED Course  I have reviewed the triage vital signs and the nursing notes.  Pertinent labs & imaging results that were available during my care of the patient were reviewed by me and considered in my medical decision making (see chart for details).    MDM Rules/Calculators/A&P                           Patient with toothache.  No gross abscess.  Exam unconcerning for Ludwig's angina or spread of infection.  Will treat with penicillin and pain medicine.  Urged patient to follow-up with dentist.     Final Clinical Impression(s) / ED Diagnoses Final diagnoses:  Pain, dental    Rx / DC Orders ED Discharge Orders         Ordered    penicillin v potassium (VEETID) 500 MG tablet  4 times daily,   Status:  Discontinued        01/24/21 1337    penicillin v potassium (VEETID) 500 MG tablet  4 times daily        01/24/21 [address], Laneka Mcgrory S, PA-C 01/24/21 1340    Jacalyn Lefevre, MD 01/25/21 1657

## 2021-02-09 ENCOUNTER — Encounter (HOSPITAL_COMMUNITY): Payer: Self-pay | Admitting: *Deleted

## 2021-02-09 ENCOUNTER — Emergency Department (HOSPITAL_COMMUNITY)
Admission: EM | Admit: 2021-02-09 | Discharge: 2021-02-10 | Disposition: A | Discharge status: Home / Self Care | Payer: Self-pay | Attending: Emergency Medicine | Admitting: Emergency Medicine

## 2021-02-09 ENCOUNTER — Other Ambulatory Visit: Payer: Self-pay

## 2021-02-09 DIAGNOSIS — F17210 Nicotine dependence, cigarettes, uncomplicated: Secondary | ICD-10-CM | POA: Insufficient documentation

## 2021-02-09 DIAGNOSIS — W57XXXA Bitten or stung by nonvenomous insect and other nonvenomous arthropods, initial encounter: Secondary | ICD-10-CM | POA: Insufficient documentation

## 2021-02-09 DIAGNOSIS — S80862A Insect bite (nonvenomous), left lower leg, initial encounter: Secondary | ICD-10-CM | POA: Insufficient documentation

## 2021-02-09 NOTE — ED Provider Notes (Signed)
Emergency Medicine Provider Triage Evaluation Note  Rachel Meadows , a 30 y.o. female  was evaluated in triage.  Pt complains of redness and pain to the left leg that has been present for 3 days. Denies fevers  Review of Systems  Positive: Redness/pain to the right leg Negative: fever  Physical Exam  BP 128/87 (BP Location: Right Arm)   Pulse (!) 105   Temp 98.4 F (36.9 C) (Oral)   Resp 16   Ht 5\' 2"  (1.575 m)   Wt 68 kg   LMP 02/03/2021 (Exact Date)   SpO2 100%   BMI 27.42 kg/m  Gen:   Awake, no distress   Resp:  Normal effort  MSK:   Moves extremities without difficulty  Other:  Erythema, induration, warmth and ttp  to the right thigh  Medical Decision Making  Medically screening exam initiated at 5:34 PM.  Appropriate orders placed.  Rachel Meadows was informed that the remainder of the evaluation will be completed by another provider, this initial triage assessment does not replace that evaluation, and the importance of remaining in the ED until their evaluation is complete.     Sandford Craze 02/09/21 1750    04/11/21, MD 02/12/21 [phone]

## 2021-02-09 NOTE — ED Triage Notes (Signed)
The pt thinks she was bitten by a spider approx 4 days ago  redness and swelling to the back of her lt thigh   lmp  June 5th

## 2021-02-10 MED ORDER — DOXYCYCLINE HYCLATE 100 MG PO CAPS: 100.0000 mg | ORAL_CAPSULE | Freq: Two times a day (BID) | ORAL | 0 refills | Status: AC

## 2021-02-10 NOTE — ED Notes (Signed)
Wound on the back of her left thigh marked with skin marker.

## 2021-02-10 NOTE — Discharge Instructions (Addendum)
You were evaluated in the Emergency Department and after careful evaluation, we did not find any emergent condition requiring admission or further testing in the hospital.  As discussed, we will treat you for Lyme's disease to be on the safe side.  This antibiotic will also help for any soft tissue skin infection.  Please keep the area clean and dry.  Please return to the ER if the area of redness spreads past the markings done by the nurse, you have worsening fevers, chills, or any other new or worsening symptoms.  Please make sure to take the antibiotic all the way as directed until finished.   I provided the contact information for [facility] which is a free clinic here in the area.  You may follow-up with them if needed.  Thank you for allowing [place] to be a part of your care.

## 2021-02-10 NOTE — ED Notes (Signed)
Patient states she isn't sure if she was bitten by something or not, c/o pain with reddness to left posterior thigh x 3 days.

## 2021-02-10 NOTE — ED Provider Notes (Addendum)
[facility] EMERGENCY DEPARTMENT Provider Note   CSN: [id number] Arrival date & time: 02/09/21  1704     History Chief Complaint  Patient presents with   lesion lt leg    Rachel Meadows is a 30 y.o. female.  HPI 30 year old female with history of anxiety, depression, GERD, sepsis presents to the ER with complaints of a questionable spider bite to the posterior left leg which occurred 3 days ago.  Patient has been doing warm compresses, however the pain and redness continues to get worse.  Denies any fevers or chills.  She does state that she has a backyard in which there could be possible ticks and she spends quite a bit of time out there.  No headache, nausea, vomiting, chest pain, vision changes.  She has not noticed any spread of the rash.    Past Medical History:  Diagnosis Date   Anxiety    Depression    panic attacks   GERD (gastroesophageal reflux disease)    Hearing loss    Kidney stones    Pregnant     Patient Active Problem List   Diagnosis Date Noted   Cellulitis of right arm 08/28/2019   Sepsis (HCC) 08/28/2019   Chlamydia infection affecting pregnancy in second trimester, antepartum 04/30/2014   Preterm premature rupture of membranes in third trimester 04/29/2014   Marijuana use 01/04/2014   Supervision of other normal pregnancy 01/03/2014   Smoker 01/03/2014    Past Surgical History:  Procedure Laterality Date   anoidectomy     adenoidectomy   CESAREAN SECTION N/A 05/21/2014   Procedure: CESAREAN SECTION;  Surgeon: Adam Phenix, MD;  Location: [facility];  Service: Obstetrics;  Laterality: N/A;   CHOLECYSTECTOMY     EXTERNAL EAR SURGERY Bilateral    TEE WITHOUT CARDIOVERSION N/A 09/01/2019   Procedure: TRANSESOPHAGEAL ECHOCARDIOGRAM (TEE);  Surgeon: Chrystie Nose, MD;  Location: [facility] ENDOSCOPY;  Service: Cardiovascular;  Laterality: N/A;   TONSILLECTOMY       OB History     Gravida  3   Para  3   Term  2   Preterm  1   AB       Living  3      SAB      IAB      Ectopic      Multiple      Live Births  3           Family History  Problem Relation Age of Onset   Kidney failure Brother     Social History   Tobacco Use   Smoking status: Every Day    Packs/day: 0.50    Years: 7.00    Pack years: 3.50    Types: Cigarettes   Smokeless tobacco: Never  Vaping Use   Vaping Use: Never used  Substance Use Topics   Alcohol use: No   Drug use: Yes    Types: Heroin    Home Medications Prior to Admission medications   Medication Sig Start Date End Date Taking? Authorizing Provider  doxycycline (VIBRAMYCIN) 100 MG capsule Take 1 capsule (100 mg total) by mouth 2 (two) times daily for 10 days. 02/10/21 02/20/21 Yes Mare Ferrari, PA-C  ALPRAZolam Prudy Feeler) 0.5 MG tablet Take 1 tablet (0.5 mg total) by mouth 3 (three) times daily as needed. for anxiety Patient not taking: Reported on 08/28/2019 03/19/16   Lazaro Arms, MD  cyclobenzaprine (FLEXERIL) 10 MG tablet Take 1 tablet (10  mg total) by mouth 2 (two) times daily as needed for muscle spasms. Patient not taking: Reported on 08/28/2019 12/11/14   Janne Napoleon, NP  HYDROcodone-acetaminophen (NORCO/VICODIN) 5-325 MG per tablet Take 1 tablet by mouth every 6 (six) hours as needed for moderate pain. Patient not taking: Reported on 08/28/2019 04/01/15   Devoria Albe, MD  ibuprofen (ADVIL,MOTRIN) 800 MG tablet Take 1 tablet (800 mg total) by mouth every 8 (eight) hours as needed. Patient not taking: Reported on 08/28/2019 09/20/14   Vickki Hearing, MD  zolpidem (AMBIEN) 10 MG tablet Take 1 tablet (10 mg total) by mouth at bedtime as needed for sleep. Patient not taking: Reported on 08/28/2019 11/15/14 08/28/19  Lazaro Arms, MD    Allergies    Patient has no known allergies.  Review of Systems   Review of Systems  Skin:  Positive for rash.   Physical Exam Updated Vital Signs BP 134/90 (BP Location: Right Arm)   Pulse 96   Temp 97.9 F  (36.6 C) (Oral)   Resp 18   Ht 5\' 2"  (1.575 m)   Wt 68 kg   LMP 02/03/2021 (Exact Date)   SpO2 99%   BMI 27.42 kg/m   Physical Exam Vitals reviewed.  Constitutional:      Appearance: Normal appearance.  HENT:     Head: Normocephalic and atraumatic.  Eyes:     General:        Right eye: No discharge.        Left eye: No discharge.     Extraocular Movements: Extraocular movements intact.     Conjunctiva/sclera: Conjunctivae normal.  Musculoskeletal:        General: No swelling. Normal range of motion.  Skin:    Comments: Large 5 to 6 cm circumferential erythema, with erythematous area near the bug bite in a circumferential pattern with surrounding lighter erythema.  No palpable fluctuance, no evidence of abscess.  Neurological:     General: No focal deficit present.     Mental Status: She is alert and oriented to person, place, and time.  Psychiatric:        Mood and Affect: Mood normal.        Behavior: Behavior normal.     ED Results / Procedures / Treatments   Labs (all labs ordered are listed, but only abnormal results are displayed) Labs Reviewed - No data to display  EKG None  Radiology No results found.  Procedures Procedures   Medications Ordered in ED Medications - No data to display  ED Course  I have reviewed the triage vital signs and the nursing notes.  Pertinent labs & imaging results that were available during my care of the patient were reviewed by me and considered in my medical decision making (see chart for details).    MDM Rules/Calculators/A&P                          30 year old female presents to the ER with complaints of a insect bite to the posterior left thigh.  Bite does appear appear to have a slight bull's-eye characteristic with central erythema and lighter circumferential clearance surrounding it.  No palpable abscess.  Nursing staff marked to the wound.  Question possible tick bite versus cellulitis.  No signs of disseminated  Lyme.  Low suspicion for [facility] spotted fever given no fever or disseminated rash.  Will initiate doxycycline treatment.  Patient encouraged to return if the  wound continues to get worse and spreads past the markings, worsening fevers, chills, or any other new or worsening symptoms.  Patient is uninsured with no PCP, will give [facility] follow-up.  She voiced understanding and is agreeable.  Stable for discharge. Final Clinical Impression(s) / ED Diagnoses Final diagnoses:  Insect bite of leg, infected, left, initial encounter    Rx / DC Orders ED Discharge Orders          Ordered    doxycycline (VIBRAMYCIN) 100 MG capsule  2 times daily        02/10/21 0802                Mare Ferrari, PA-C 02/10/21 1517    Margarita Grizzle, MD 02/11/21 1611

## 2021-02-10 NOTE — ED Notes (Signed)
Pt updated on wait time.  

## 2021-03-14 ENCOUNTER — Ambulatory Visit (HOSPITAL_COMMUNITY): Payer: No Payment, Other | Admitting: Licensed Clinical Social Worker

## 2021-03-14 ENCOUNTER — Ambulatory Visit (HOSPITAL_COMMUNITY): Payer: Self-pay | Admitting: Physician Assistant

## 2021-11-20 ENCOUNTER — Other Ambulatory Visit: Payer: Self-pay

## 2022-05-05 ENCOUNTER — Emergency Department (HOSPITAL_COMMUNITY): Payer: Self-pay

## 2022-05-05 ENCOUNTER — Emergency Department (HOSPITAL_COMMUNITY)
Admission: EM | Admit: 2022-05-05 | Discharge: 2022-05-06 | Disposition: A | Discharge status: Home / Self Care | Payer: Self-pay | Attending: Emergency Medicine | Admitting: Emergency Medicine

## 2022-05-05 ENCOUNTER — Other Ambulatory Visit: Payer: Self-pay

## 2022-05-05 DIAGNOSIS — T40601A Poisoning by unspecified narcotics, accidental (unintentional), initial encounter: Secondary | ICD-10-CM

## 2022-05-05 DIAGNOSIS — T402X1A Poisoning by other opioids, accidental (unintentional), initial encounter: Secondary | ICD-10-CM | POA: Insufficient documentation

## 2022-05-05 LAB — COMPREHENSIVE METABOLIC PANEL
ALT: 32 U/L (ref 0–44)
AST: 30 U/L (ref 15–41)
Albumin: 3.9 g/dL (ref 3.5–5.0)
Alkaline Phosphatase: 63 U/L (ref 38–126)
Anion gap: 9 (ref 5–15)
BUN: 10 mg/dL (ref 6–20)
CO2: 24 mmol/L (ref 22–32)
Calcium: 8.5 mg/dL — ABNORMAL LOW (ref 8.9–10.3)
Chloride: 102 mmol/L (ref 98–111)
Creatinine, Ser: 0.65 mg/dL (ref 0.44–1.00)
GFR, Estimated: >60 mL/min (ref >60)
Glucose, Bld: 147 mg/dL — ABNORMAL HIGH (ref 70–99)
Potassium: 3.4 mmol/L — ABNORMAL LOW (ref 3.5–5.1)
Sodium: 135 mmol/L (ref 135–145)
Total Bilirubin: 0.7 mg/dL (ref 0.3–1.2)
Total Protein: 8 g/dL (ref 6.5–8.1)

## 2022-05-05 LAB — CBC WITH DIFFERENTIAL/PLATELET
Abs Immature Granulocytes: 0.07 10*3/uL (ref 0.00–0.07)
Basophils Absolute: 0.1 10*3/uL (ref 0.0–0.1)
Basophils Relative: 1 %
Eosinophils Absolute: 0.4 10*3/uL (ref 0.0–0.5)
Eosinophils Relative: 2 %
HCT: 35 % — ABNORMAL LOW (ref 36.0–46.0)
Hemoglobin: 12 g/dL (ref 12.0–15.0)
Immature Granulocytes: 0 %
Lymphocytes Relative: 41 %
Lymphs Abs: 7.7 10*3/uL — ABNORMAL HIGH (ref 0.7–4.0)
MCH: 32.2 pg (ref 26.0–34.0)
MCHC: 34.3 g/dL (ref 30.0–36.0)
MCV: 93.8 fL (ref 80.0–100.0)
Monocytes Absolute: 1.8 10*3/uL — ABNORMAL HIGH (ref 0.1–1.0)
Monocytes Relative: 10 %
Neutro Abs: 8.9 10*3/uL — ABNORMAL HIGH (ref 1.7–7.7)
Neutrophils Relative %: 46 %
Platelets: 425 10*3/uL — ABNORMAL HIGH (ref 150–400)
RBC: 3.73 MIL/uL — ABNORMAL LOW (ref 3.87–5.11)
RDW: 12.2 % (ref 11.5–15.5)
WBC Morphology: ABNORMAL
WBC: 18.9 10*3/uL — ABNORMAL HIGH (ref 4.0–10.5)
nRBC: 0 % (ref 0.0–0.2)

## 2022-05-05 LAB — BLOOD GAS, VENOUS
Acid-Base Excess: 3.7 mmol/L — ABNORMAL HIGH (ref 0.0–2.0)
Bicarbonate: 29.7 mmol/L — ABNORMAL HIGH (ref 20.0–28.0)
Drawn by: 51519
O2 Saturation: 91.4 %
Patient temperature: 37.4
pCO2, Ven: 50 mmHg (ref 44–60)
pH, Ven: 7.38 (ref 7.25–7.43)
pO2, Ven: 62 mmHg — ABNORMAL HIGH (ref 32–45)

## 2022-05-05 LAB — I-STAT BETA HCG BLOOD, ED (MC, WL, AP ONLY): I-stat hCG, quantitative: <5 m[IU]/mL (ref <5)

## 2022-05-05 LAB — ETHANOL: Alcohol, Ethyl (B): <10 mg/dL (ref <10)

## 2022-05-05 MED ORDER — NALOXONE HCL 0.4 MG/ML IJ SOLN
0.4000 mg | Freq: Once | INTRAMUSCULAR | Status: AC
  Administered 2022-05-05: 0.4 mg via INTRAVENOUS

## 2022-05-05 NOTE — ED Notes (Addendum)
Pt responding to painful stimuli and beginning to awake to answer questions at this time.

## 2022-05-05 NOTE — ED Triage Notes (Signed)
Pt arrived via POV cyanotic with absent respirations.

## 2022-05-05 NOTE — ED Notes (Signed)
Pt tearful at this time. Pt transitioned off of 15L NRB to 2L Nasal Cannula. O2 Sats maintaining at 100%.

## 2022-05-05 NOTE — ED Notes (Signed)
Reminded pt that a urine sample is needed. Pt is going to try to use the purewick that is placed.

## 2022-05-06 ENCOUNTER — Encounter (HOSPITAL_COMMUNITY): Payer: Self-pay

## 2022-05-06 MED ORDER — NALOXONE HCL 4 MG/0.1ML NA LIQD: 1.0000 | Freq: Once | NASAL | Status: AC

## 2022-05-06 MED ORDER — NALOXONE HCL 4 MG/0.1ML NA LIQD
NASAL | Status: AC
  Administered 2022-05-06: 1 via NASAL
  Filled 2022-05-06: qty 4

## 2022-05-06 NOTE — ED Notes (Signed)
Pt provided PO Fluids and snack at this time.

## 2022-05-06 NOTE — ED Provider Notes (Signed)
Patient signed out to me to monitor.  Patient was brought to the ER POV after unintentional opiate overdose.  Patient has been monitored.  She has now easily arousable, no complaints.  No suicidality or homicidality.  She reports that she does not have a way to go home at this point.  We will allow her to sleep through the night and discharge in the morning.   Gilda Crease, MD 05/06/22 0157

## 2022-05-06 NOTE — ED Notes (Signed)
Pt provided a 2nd beverage at this time. Pr reports her body feels heavy, sore and aches all over. Pt describes going through withdrawals before and reports this situation feels similar to those previous instances. Pt aware we are awaiting a urine sample, but Pt is unable to provide a urine specimen at this time.

## 2022-05-06 NOTE — ED Provider Notes (Signed)
[facility] EMERGENCY DEPARTMENT Provider Note   CSN: [id number] Arrival date & time: 05/05/22  2246     History  Chief Complaint  Patient presents with   Drug Overdose    Rachel Meadows is a 31 y.o. female.   Drug Overdose  Patient presents unresponsive.  Dropped off at the front of the hospital.  Cyanotic and minimally breathing.  Reported overdose.  Brought back to room immediately.  Given initially 0.4 and then another milligram of Narcan with improvement of mental status.  Still initially not able to tell me what she took however.  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    Past Medical History:  Diagnosis Date   Anxiety    Depression    panic attacks   GERD (gastroesophageal reflux disease)    Hearing loss    Kidney stones    Pregnant     Home Medications Prior to Admission medications   Medication Sig Start Date End Date Taking? Authorizing Provider  ALPRAZolam Prudy Feeler) 0.5 MG tablet Take 1 tablet (0.5 mg total) by mouth 3 (three) times daily as needed. for anxiety Patient not taking: Reported on 08/28/2019 03/19/16   Lazaro Arms, MD  cyclobenzaprine (FLEXERIL) 10 MG tablet Take 1 tablet (10 mg total) by mouth 2 (two) times daily as needed for muscle spasms. Patient not taking: Reported on 08/28/2019 12/11/14   Janne Napoleon, NP  HYDROcodone-acetaminophen (NORCO/VICODIN) 5-325 MG per tablet Take 1 tablet by mouth every 6 (six) hours as needed for moderate pain. Patient not taking: Reported on 08/28/2019 04/01/15   Devoria Albe, MD  ibuprofen (ADVIL,MOTRIN) 800 MG tablet Take 1 tablet (800 mg total) by mouth every 8 (eight) hours as needed. Patient not taking: Reported on 08/28/2019 09/20/14   Vickki Hearing, MD  zolpidem (AMBIEN) 10 MG tablet Take 1 tablet (10 mg total) by mouth at bedtime as needed for sleep. Patient not taking: Reported on 08/28/2019 11/15/14 08/28/19  Lazaro Arms, MD      Allergies    Patient has no known allergies.    Review of Systems    Review of Systems  Physical Exam Updated Vital Signs BP 117/77   Pulse (!) 113   Temp 99.2 F (37.3 C) (Oral)   Resp (!) 27   Ht 5\' 2"  (1.575 m)   Wt 82.9 kg   SpO2 100%   BMI 33.42 kg/m  Physical Exam Vitals and nursing note reviewed.  Eyes:     Pupils: Pupils are equal, round, and reactive to light.  Cardiovascular:     Rate and Rhythm: Regular rhythm.  Chest:     Chest wall: No tenderness.  Abdominal:     Tenderness: There is no abdominal tenderness.  Skin:    General: Skin is warm.     Comments: Mottled and cyanotic.  Neurological:     Mental Status: She is alert.     ED Results / Procedures / Treatments   Labs (all labs ordered are listed, but only abnormal results are displayed) Labs Reviewed  COMPREHENSIVE METABOLIC PANEL - Abnormal; Notable for the following components:      Result Value   Potassium 3.4 (*)    Glucose, Bld 147 (*)    Calcium 8.5 (*)    All other components within normal limits  CBC WITH DIFFERENTIAL/PLATELET - Abnormal; Notable for the following components:   WBC 18.9 (*)    RBC 3.73 (*)    HCT 35.0 (*)  Platelets 425 (*)    Neutro Abs 8.9 (*)    Lymphs Abs 7.7 (*)    Monocytes Absolute 1.8 (*)    All other components within normal limits  BLOOD GAS, VENOUS - Abnormal; Notable for the following components:   pO2, Ven 62 (*)    Bicarbonate 29.7 (*)    Acid-Base Excess 3.7 (*)    All other components within normal limits  ETHANOL  RAPID URINE DRUG SCREEN, HOSP PERFORMED  I-STAT BETA HCG BLOOD, ED ([facility], [facility], [facility] ONLY)    EKG None  Radiology DG Chest Portable 1 View  Result Date: 05/05/2022 CLINICAL DATA:  Drug overdose.  Cyanotic with absent respirations. EXAM: PORTABLE CHEST 1 VIEW COMPARISON:  08/27/2019. FINDINGS: The heart size and mediastinal contours are within normal limits. No consolidation, effusion, or pneumothorax. No acute osseous abnormality. IMPRESSION: No active disease. Electronically Signed   By: Thornell Sartorius  M.D.   On: 05/05/2022 23:08    Procedures Procedures    Medications Ordered in ED Medications  naloxone [facility]) injection 0.4 mg (0.4 mg Intravenous Given 05/05/22 2254)  naloxone [facility]) injection 0.4 mg (0.4 mg Intravenous Given 05/05/22 2254)    ED Course/ Medical Decision Making/ A&P                           Medical Decision Making Amount and/or Complexity of Data Reviewed Labs: ordered. Radiology: ordered.  Risk Prescription drug management.   Patient came in unresponsive.  Apparent opiate overdose.  Initially not providing much history.  Improved after Narcan.  Did not have loss of pulse but was hypoxic upon arrival.  Improved.  Will give time for metabolism and reevaluation.  Care will be turned over to Dr. Oletta Cohn.        Final Clinical Impression(s) / ED Diagnoses Final diagnoses:  Opiate overdose, accidental or unintentional, initial encounter [facility])    Rx / DC Orders ED Discharge Orders     None         Benjiman Core, MD 05/06/22 0003

## 2022-05-06 NOTE — ED Notes (Signed)
Pt educated on how to administer Narcan spray and encouraged to keep med with her and let family and friends know the location of the medication

## 2022-05-06 NOTE — ED Notes (Signed)
ED Provider at bedside. 

## 2022-05-06 NOTE — ED Notes (Signed)
Pt provided socks, snack items, and drink, pt given phone to contact family or friends, pt left the department stating, "I can't believe you discharge patients from the hospital like this. I am going to make sure everybody knows." Pt declines waiting in the lobby or speaking with a doctor until she can contact a ride
